# Patient Record
Sex: Female | Born: 1990 | Hispanic: No | Marital: Single | Smoking: Never smoker
Health system: Southern US, Community
[De-identification: ages and names within clinical notes are randomized; demographics above are authoritative.]

## PROBLEM LIST (undated history)

## (undated) DIAGNOSIS — K648 Other hemorrhoids: Secondary | ICD-10-CM

## (undated) DIAGNOSIS — D352 Benign neoplasm of pituitary gland: Secondary | ICD-10-CM

## (undated) DIAGNOSIS — E221 Hyperprolactinemia: Secondary | ICD-10-CM

## (undated) HISTORY — DX: Other hemorrhoids: K64.8

## (undated) HISTORY — PX: HEMORROIDECTOMY: SUR656

## (undated) HISTORY — DX: Benign neoplasm of pituitary gland: D35.2

---

## 2018-04-08 DIAGNOSIS — E221 Hyperprolactinemia: Secondary | ICD-10-CM | POA: Insufficient documentation

## 2018-04-08 DIAGNOSIS — D352 Benign neoplasm of pituitary gland: Secondary | ICD-10-CM | POA: Insufficient documentation

## 2019-02-08 DIAGNOSIS — E559 Vitamin D deficiency, unspecified: Secondary | ICD-10-CM | POA: Insufficient documentation

## 2019-07-03 ENCOUNTER — Telehealth: Payer: Self-pay

## 2019-07-03 NOTE — Telephone Encounter (Signed)
Patient called stating that she was having some spotting. Patient is coming for new ob on Sept 8th. Patient states it was some light pink spotting. Made here aware that this is ok unless it becomes heavier like a period then she would need to go to [facility] at [facility] for evaluation. Patient reports no other symptoms. Patient states understanding and agreeable

## 2019-07-31 ENCOUNTER — Other Ambulatory Visit: Payer: Self-pay

## 2019-07-31 ENCOUNTER — Ambulatory Visit (INDEPENDENT_AMBULATORY_CARE_PROVIDER_SITE_OTHER): Payer: BLUE CROSS/BLUE SHIELD | Admitting: Advanced Practice Midwife

## 2019-07-31 ENCOUNTER — Encounter: Payer: Self-pay | Admitting: Advanced Practice Midwife

## 2019-07-31 ENCOUNTER — Other Ambulatory Visit (HOSPITAL_COMMUNITY)
Admission: RE | Admit: 2019-07-31 | Discharge: 2019-07-31 | Disposition: A | Discharge status: Home / Self Care | Payer: BLUE CROSS/BLUE SHIELD | Source: Ambulatory Visit | Attending: Advanced Practice Midwife | Admitting: Advanced Practice Midwife

## 2019-07-31 DIAGNOSIS — Z3401 Encounter for supervision of normal first pregnancy, first trimester: Secondary | ICD-10-CM

## 2019-07-31 DIAGNOSIS — Z3A11 11 weeks gestation of pregnancy: Secondary | ICD-10-CM

## 2019-07-31 DIAGNOSIS — Z23 Encounter for immunization: Secondary | ICD-10-CM

## 2019-07-31 DIAGNOSIS — Z3400 Encounter for supervision of normal first pregnancy, unspecified trimester: Secondary | ICD-10-CM | POA: Insufficient documentation

## 2019-07-31 DIAGNOSIS — D352 Benign neoplasm of pituitary gland: Secondary | ICD-10-CM

## 2019-07-31 MED ORDER — AMBULATORY NON FORMULARY MEDICATION: 1.0000 | 0 refills | Status: DC

## 2019-07-31 NOTE — Progress Notes (Signed)
Pt had U/S last week in [facility] imaging.

## 2019-07-31 NOTE — Patient Instructions (Signed)
First Trimester of Pregnancy  The first trimester of pregnancy is from week 1 until the end of week 13 (months 1 through 3). During this time, your baby will begin to develop inside you. At 6-8 weeks, the eyes and face are formed, and the heartbeat can be seen on ultrasound. At the end of 12 weeks, all the baby's organs are formed. Prenatal care is all the medical care you receive before the birth of your baby. Make sure you get good prenatal care and follow all of your doctor's instructions. Follow these instructions at home: Medicines  Take over-the-counter and prescription medicines only as told by your doctor. Some medicines are safe and some medicines are not safe during pregnancy.  Take a prenatal vitamin that contains at least 600 micrograms (mcg) of folic acid.  If you have trouble pooping (constipation), take medicine that will make your stool soft (stool softener) if your doctor approves. Eating and drinking   Eat regular, healthy meals.  Your doctor will tell you the amount of weight gain that is right for you.  Avoid raw meat and uncooked cheese.  If you feel sick to your stomach (nauseous) or throw up (vomit): ? Eat 4 or 5 small meals a day instead of 3 large meals. ? Try eating a few soda crackers. ? Drink liquids between meals instead of during meals.  To prevent constipation: ? Eat foods that are high in fiber, like fresh fruits and vegetables, whole grains, and beans. ? Drink enough fluids to keep your pee (urine) clear or pale yellow. Activity  Exercise only as told by your doctor. Stop exercising if you have cramps or pain in your lower belly (abdomen) or low back.  Do not exercise if it is too hot, too humid, or if you are in a place of great height (high altitude).  Try to avoid standing for long periods of time. Move your legs often if you must stand in one place for a long time.  Avoid heavy lifting.  Wear low-heeled shoes. Sit and stand up straight.   You can have sex unless your doctor tells you not to. Relieving pain and discomfort  Wear a good support bra if your breasts are sore.  Take warm water baths (sitz baths) to soothe pain or discomfort caused by hemorrhoids. Use hemorrhoid cream if your doctor says it is okay.  Rest with your legs raised if you have leg cramps or low back pain.  If you have puffy, bulging veins (varicose veins) in your legs: ? Wear support hose or compression stockings as told by your doctor. ? Raise (elevate) your feet for 15 minutes, 3-4 times a day. ? Limit salt in your food. Prenatal care  Schedule your prenatal visits by the twelfth week of pregnancy.  Write down your questions. Take them to your prenatal visits.  Keep all your prenatal visits as told by your doctor. This is important. Safety  Wear your seat belt at all times when driving.  Make a list of emergency phone numbers. The list should include numbers for family, friends, the hospital, and police and fire departments. General instructions  Ask your doctor for a referral to a local prenatal class. Begin classes no later than at the start of month 6 of your pregnancy.  Ask for help if you need counseling or if you need help with nutrition. Your doctor can give you advice or tell you where to go for help.  Do not use hot tubs, steam   rooms, or saunas. °· Do not douche or use tampons or scented sanitary pads. °· Do not cross your legs for long periods of time. °· Avoid all herbs and alcohol. Avoid drugs that are not approved by your doctor. °· Do not use any tobacco products, including cigarettes, chewing tobacco, and electronic cigarettes. If you need help quitting, ask your doctor. You may get counseling or other support to help you quit. °· Avoid cat litter boxes and soil used by cats. These carry germs that can cause birth defects in the baby and can cause a loss of your baby (miscarriage) or stillbirth. °· Visit your dentist.  At home, brush your teeth with a soft toothbrush. Be gentle when you floss. °Contact a doctor if: °· You are dizzy. °· You have mild cramps or pressure in your lower belly. °· You have a nagging pain in your belly area. °· You continue to feel sick to your stomach, you throw up, or you have watery poop (diarrhea). °· You have a bad smelling fluid coming from your vagina. °· You have pain when you pee (urinate). °· You have increased puffiness (swelling) in your face, hands, legs, or ankles. °Get help right away if: °· You have a fever. °· You are leaking fluid from your vagina. °· You have spotting or bleeding from your vagina. °· You have very bad belly cramping or pain. °· You gain or lose weight rapidly. °· You throw up blood. It may look like coffee grounds. °· You are around people who have German measles, fifth disease, or chickenpox. °· You have a very bad headache. °· You have shortness of breath. °· You have any kind of trauma, such as from a fall or a car accident. °Summary °· The first trimester of pregnancy is from week 1 until the end of week 13 (months 1 through 3). °· To take care of yourself and your unborn baby, you will need to eat healthy meals, take medicines only if your doctor tells you to do so, and do activities that are safe for you and your baby. °· Keep all follow-up visits as told by your doctor. This is important as your doctor will have to ensure that your baby is healthy and growing well. °This information is not intended to replace advice given to you by your health care provider. Make sure you discuss any questions you have with your health care provider. °Document Released: 04/26/2008 Document Revised: 03/01/2019 Document Reviewed: 11/16/2016 °Elsevier Patient Education © 2020 [employer] ° °

## 2019-07-31 NOTE — Progress Notes (Signed)
  Subjective:    Sandra Smith is being seen today for her first obstetrical visit.  This is a planned pregnancy. She is at [redacted]w[redacted]d gestation. Her obstetrical history is significant for history of pituitary adenoma per pt report.  Was on medication until pregnancy then stopped.  . Relationship with FOB: spouse, living together. Patient does intend to breast feed. Pregnancy history fully reviewed.  Patient reports no complaints.  Review of Systems:   Review of Systems  Constitutional: Negative for chills, fatigue and fever.  Respiratory: Negative for shortness of breath.   Gastrointestinal: Negative for abdominal pain, constipation and diarrhea.  Genitourinary: Negative for dysuria, pelvic pain and vaginal bleeding.    Objective:     BP 95/70   Pulse 88   Ht 5\' 6"  (1.676 m)   Wt 64 kg   LMP 05/13/2019 (Exact Date)   BMI 22.78 kg/m  Physical Exam  Constitutional: She is oriented to person, place, and time. She appears well-developed and well-nourished. No distress.  HENT:  Head: Normocephalic.  Cardiovascular: Normal rate and regular rhythm.  Respiratory: Effort normal. No respiratory distress. She has no wheezes. She has no rales. She exhibits no tenderness.  Bilateral breast exam normal  GI: Soft.  Genitourinary:    Vulva normal.     Genitourinary Comments: Pap done EGBUS normal Cervix closed and long Uterus appropriate for GA   Musculoskeletal: Normal range of motion.  Neurological: She is alert and oriented to person, place, and time.  Skin: Skin is warm and dry.  Psychiatric: She has a normal mood and affect.    Maternal Exam:  Introitus: Normal vulva. Normal vagina.  Cervix: Cervix evaluated by digital exam.        Assessment:    Pregnancy: G1P0 Patient Active Problem List   Diagnosis Date Noted  . Supervision of normal first pregnancy, antepartum 07/31/2019       Plan:     Initial labs drawn. Prenatal vitamins. Problem list reviewed and  updated. Panorama discussed: requested. Role of ultrasound in pregnancy discussed; fetal survey: requested. Amniocentesis discussed: not indicated. Follow up in 4 weeks. 50% of 30 min visit spent on counseling and coordination of care.   Welcomed to practice. Routines reviewed Will send ROI to primary doctor to investigate details about pituitary adenoma    Hansel Feinstein 07/31/2019

## 2019-08-01 LAB — CYTOLOGY - PAP
Chlamydia: NEGATIVE
Diagnosis: NEGATIVE
Neisseria Gonorrhea: NEGATIVE

## 2019-08-02 LAB — URINE CULTURE, OB REFLEX

## 2019-08-02 LAB — CULTURE, OB URINE

## 2019-08-13 LAB — SMN1 COPY NUMBER ANALYSIS (SMA CARRIER SCREENING)

## 2019-08-13 LAB — OBSTETRIC PANEL, INCLUDING HIV
Antibody Screen: NEGATIVE
Basophils Absolute: 0 10*3/uL (ref 0.0–0.2)
Basos: 1 %
EOS (ABSOLUTE): 0.1 10*3/uL (ref 0.0–0.4)
Eos: 1 %
HIV Screen 4th Generation wRfx: NONREACTIVE
Hematocrit: 39.5 % (ref 34.0–46.6)
Hemoglobin: 12.3 g/dL (ref 11.1–15.9)
Hepatitis B Surface Ag: NEGATIVE
Immature Grans (Abs): 0 10*3/uL (ref 0.0–0.1)
Immature Granulocytes: 0 %
Lymphocytes Absolute: 1.6 10*3/uL (ref 0.7–3.1)
Lymphs: 22 %
MCH: 27 pg (ref 26.6–33.0)
MCHC: 31.1 g/dL — ABNORMAL LOW (ref 31.5–35.7)
MCV: 87 fL (ref 79–97)
Monocytes Absolute: 0.4 10*3/uL (ref 0.1–0.9)
Monocytes: 5 %
Neutrophils Absolute: 5.3 10*3/uL (ref 1.4–7.0)
Neutrophils: 71 %
Platelets: 175 10*3/uL (ref 150–450)
RBC: 4.56 x10E6/uL (ref 3.77–5.28)
RDW: 13.8 % (ref 11.7–15.4)
RPR Ser Ql: NONREACTIVE
Rh Factor: POSITIVE
Rubella Antibodies, IGG: 24.4 index (ref >0.99)
WBC: 7.5 10*3/uL (ref 3.4–10.8)

## 2019-08-13 LAB — HEMOGLOBINOPATHY EVALUATION
Ferritin: 23 ng/mL (ref 15–150)
Hgb A2 Quant: 2.7 % (ref 1.8–3.2)
Hgb A: 97.3 % (ref 96.4–98.8)
Hgb C: 0 %
Hgb F Quant: 0 % (ref 0.0–2.0)
Hgb S: 0 %
Hgb Solubility: NEGATIVE
Hgb Variant: 0 %

## 2019-08-13 LAB — CYSTIC FIBROSIS GENE TEST

## 2019-08-15 ENCOUNTER — Telehealth: Payer: Self-pay

## 2019-08-15 NOTE — Telephone Encounter (Signed)
Pt called the office requesting Panorama results. Pt made aware that due to insufficient cells we will have to repeat the Panorama. Understanding was voiced.  chiquita l wilson, CMA

## 2019-08-27 ENCOUNTER — Encounter: Payer: Self-pay | Admitting: Obstetrics & Gynecology

## 2019-08-27 ENCOUNTER — Ambulatory Visit (INDEPENDENT_AMBULATORY_CARE_PROVIDER_SITE_OTHER): Payer: BLUE CROSS/BLUE SHIELD | Admitting: Obstetrics & Gynecology

## 2019-08-27 ENCOUNTER — Other Ambulatory Visit: Payer: Self-pay

## 2019-08-27 VITALS — BP 106/71 | HR 79 | Wt 146.0 lb

## 2019-08-27 DIAGNOSIS — Z3A15 15 weeks gestation of pregnancy: Secondary | ICD-10-CM

## 2019-08-27 DIAGNOSIS — Z3402 Encounter for supervision of normal first pregnancy, second trimester: Secondary | ICD-10-CM

## 2019-08-27 DIAGNOSIS — D352 Benign neoplasm of pituitary gland: Secondary | ICD-10-CM

## 2019-08-27 DIAGNOSIS — Z3400 Encounter for supervision of normal first pregnancy, unspecified trimester: Secondary | ICD-10-CM

## 2019-08-27 NOTE — Progress Notes (Signed)
Repeat Panorama today due to insufficient cells. Kathrene Alu RN

## 2019-08-27 NOTE — Progress Notes (Signed)
   PRENATAL VISIT NOTE  Subjective:  Sandra Smith is a 28 y.o. G1P0 at [redacted]w[redacted]d being seen today for ongoing prenatal care.  She is currently monitored for the following issues for this low-risk pregnancy and has Supervision of normal first pregnancy, antepartum and Pituitary adenoma ([place]) on their problem list.  Patient reports no complaints. Had n/v which was resolved.   Contractions: Not present. Vag. Bleeding: None.  Movement: Absent. Denies leaking of fluid.   The following portions of the patient's history were reviewed and updated as appropriate: allergies, current medications, past family history, past medical history, past social history, past surgical history and problem list.   Objective:   Vitals:   08/27/19 0837  BP: 106/71  Pulse: 79  Weight: 146 lb (66.2 kg)    Fetal Status: Fetal Heart Rate (bpm): 160   Movement: Absent     General:  Alert, oriented and cooperative. Patient is in no acute distress.  Skin: Skin is warm and dry. No rash noted.   Cardiovascular: Normal heart rate noted  Respiratory: Normal respiratory effort, no problems with respiration noted  Abdomen: Soft, gravid, appropriate for gestational age.  Pain/Pressure: Absent     Pelvic: No pelvic exam indicated  Extremities: Normal range of motion.  Edema: None  Mental Status: Normal mood and affect. Normal behavior. Normal judgment and thought content.   Assessment and Plan:  Pregnancy: G1P0 at [redacted]w[redacted]d 1. Supervision of normal first pregnancy, antepartum Anatomy scan 11/2 Needs NIPS and AFP today  2. Pituitary adenoma ([place]) No results seen for MRI in system. Pt will sign release of records so we can get the results. .    3 vaginal bleeding in pregnancy Pt was seen at [facility] for vaginal bleeding >4 weeks prev. No records seen. She deneuis further bleeding.   Preterm labor symptoms and general obstetric precautions including but not limited to vaginal bleeding, contractions, leaking of fluid and fetal movement  were reviewed in detail with the patient. Please refer to After Visit Summary for other counseling recommendations.   Return in about 5 weeks (around 10/01/2019).  Future Appointments  Date Time Provider [place]  09/24/2019  2:30 PM WH-MFC [place] 1 WH-MFCUS MFC-US    Lavonia Drafts, MD

## 2019-08-27 NOTE — Patient Instructions (Signed)

## 2019-08-30 LAB — AFP, SERUM, OPEN SPINA BIFIDA
AFP MoM: 1.43
AFP Value: 42.9 ng/mL
Gest. Age on Collection Date: 15.1 weeks
Maternal Age At EDD: 30.1 yr
OSBR Risk 1 IN: 3311
Test Results:: NEGATIVE
Weight: 146 [lb_av]

## 2019-09-03 ENCOUNTER — Other Ambulatory Visit: Payer: Self-pay

## 2019-09-03 DIAGNOSIS — Z3400 Encounter for supervision of normal first pregnancy, unspecified trimester: Secondary | ICD-10-CM

## 2019-09-24 ENCOUNTER — Ambulatory Visit (HOSPITAL_COMMUNITY)
Admission: RE | Admit: 2019-09-24 | Discharge: 2019-09-24 | Disposition: A | Discharge status: Home / Self Care | Payer: BLUE CROSS/BLUE SHIELD | Source: Ambulatory Visit | Attending: Advanced Practice Midwife | Admitting: Advanced Practice Midwife

## 2019-09-24 ENCOUNTER — Other Ambulatory Visit: Payer: Self-pay

## 2019-09-24 DIAGNOSIS — Z363 Encounter for antenatal screening for malformations: Secondary | ICD-10-CM

## 2019-09-24 DIAGNOSIS — Z3A19 19 weeks gestation of pregnancy: Secondary | ICD-10-CM

## 2019-09-24 DIAGNOSIS — Z3400 Encounter for supervision of normal first pregnancy, unspecified trimester: Secondary | ICD-10-CM | POA: Diagnosis present

## 2019-10-01 ENCOUNTER — Other Ambulatory Visit: Payer: Self-pay

## 2019-10-01 ENCOUNTER — Ambulatory Visit (INDEPENDENT_AMBULATORY_CARE_PROVIDER_SITE_OTHER): Payer: BLUE CROSS/BLUE SHIELD | Admitting: Obstetrics & Gynecology

## 2019-10-01 VITALS — BP 105/71 | HR 108 | Wt 154.0 lb

## 2019-10-01 DIAGNOSIS — O26892 Other specified pregnancy related conditions, second trimester: Secondary | ICD-10-CM

## 2019-10-01 DIAGNOSIS — Z3400 Encounter for supervision of normal first pregnancy, unspecified trimester: Secondary | ICD-10-CM

## 2019-10-01 DIAGNOSIS — Z3A20 20 weeks gestation of pregnancy: Secondary | ICD-10-CM

## 2019-10-01 DIAGNOSIS — D352 Benign neoplasm of pituitary gland: Secondary | ICD-10-CM

## 2019-10-01 NOTE — Patient Instructions (Signed)

## 2019-10-01 NOTE — Progress Notes (Signed)
   PRENATAL VISIT NOTE  Subjective:  Sandra Smith is a 28 y.o. G1P0 at [redacted]w[redacted]d being seen today for ongoing prenatal care.  She is currently monitored for the following issues for this low-risk pregnancy and has Supervision of normal first pregnancy, antepartum and Pituitary adenoma ([place]) on their problem list.  Patient reports numbness in shoulder adn legs at night occ. .  Contractions: Not present. Vag. Bleeding: None.  Movement: Present. Denies leaking of fluid.   The following portions of the patient's history were reviewed and updated as appropriate: allergies, current medications, past family history, past medical history, past social history, past surgical history and problem list.   Objective:   Vitals:   10/01/19 0942  BP: 105/71  Pulse: (!) 108  Weight: 154 lb (69.9 kg)    Fetal Status: Fetal Heart Rate (bpm): 160   Movement: Present     General:  Alert, oriented and cooperative. Patient is in no acute distress.  Skin: Skin is warm and dry. No rash noted.   Cardiovascular: Normal heart rate noted  Respiratory: Normal respiratory effort, no problems with respiration noted  Abdomen: Soft, gravid, appropriate for gestational age.  Pain/Pressure: Absent     Pelvic: Cervical exam deferred        Extremities: Normal range of motion.  Edema: None  Mental Status: Normal mood and affect. Normal behavior. Normal judgment and thought content.   Assessment and Plan:  Pregnancy: G1P0 at [redacted]w[redacted]d 1. Supervision of normal first pregnancy, antepartum 2 hour GTT and labs next visit   2. Pituitary adenoma ([place]) S/p MRI 10/9/202 from [facility] 60mm microadenoma  asymptomatic  Preterm labor symptoms and general obstetric precautions including but not limited to vaginal bleeding, contractions, leaking of fluid and fetal movement were reviewed in detail with the patient. Please refer to After Visit Summary for other counseling recommendations.   F/u in 8 weeks or sooner prn  No future appointments.   Lavonia Drafts, MD

## 2019-12-04 ENCOUNTER — Encounter: Payer: Self-pay | Admitting: Advanced Practice Midwife

## 2019-12-04 ENCOUNTER — Ambulatory Visit (INDEPENDENT_AMBULATORY_CARE_PROVIDER_SITE_OTHER): Payer: Medicaid Other | Admitting: Advanced Practice Midwife

## 2019-12-04 ENCOUNTER — Other Ambulatory Visit: Payer: Self-pay

## 2019-12-04 VITALS — BP 114/72 | HR 89 | Wt 173.0 lb

## 2019-12-04 DIAGNOSIS — Z23 Encounter for immunization: Secondary | ICD-10-CM | POA: Diagnosis not present

## 2019-12-04 DIAGNOSIS — D352 Benign neoplasm of pituitary gland: Secondary | ICD-10-CM

## 2019-12-04 DIAGNOSIS — Z3400 Encounter for supervision of normal first pregnancy, unspecified trimester: Secondary | ICD-10-CM

## 2019-12-04 DIAGNOSIS — Z3A29 29 weeks gestation of pregnancy: Secondary | ICD-10-CM

## 2019-12-04 DIAGNOSIS — O26893 Other specified pregnancy related conditions, third trimester: Secondary | ICD-10-CM

## 2019-12-04 NOTE — Progress Notes (Signed)
   PRENATAL VISIT NOTE  Subjective:  Sandra Smith is a 29 y.o. G1P0 at [redacted]w[redacted]d being seen today for ongoing prenatal care.  She is currently monitored for the following issues for this high-risk pregnancy and has Supervision of normal first pregnancy, antepartum; Pituitary adenoma ([place]); Hyperprolactinemia ([place]); and Vitamin D deficiency on their problem list.  Patient reports no complaints.  Contractions: Not present. Vag. Bleeding: None.  Movement: Present. Denies leaking of fluid.   The following portions of the patient's history were reviewed and updated as appropriate: allergies, current medications, past family history, past medical history, past social history, past surgical history and problem list.   Objective:   Vitals:   12/04/19 0815  BP: 114/72  Pulse: 89  Weight: 173 lb (78.5 kg)    Fetal Status:     Movement: Present     General:  Alert, oriented and cooperative. Patient is in no acute distress.  Skin: Skin is warm and dry. No rash noted.   Cardiovascular: Normal heart rate noted  Respiratory: Normal respiratory effort, no problems with respiration noted  Abdomen: Soft, gravid, appropriate for gestational age.  Pain/Pressure: Absent     Pelvic: Cervical exam deferred        Extremities: Normal range of motion.  Edema: Trace  Mental Status: Normal mood and affect. Normal behavior. Normal judgment and thought content.   Assessment and Plan:  Pregnancy: G1P0 at [redacted]w[redacted]d 1. Pituitary adenoma [facility])     Doing well     Consult Dr [facility] re: special monitoring     Her MD took her off Dostinex during pregnancy  2.  Sup Normal Pregnancy      Glucola and labs today  3.   Needs TDAP       Wants to get vaccine today  Preterm labor symptoms and general obstetric precautions including but not limited to vaginal bleeding, contractions, leaking of fluid and fetal movement were reviewed in detail with the patient. Please refer to After Visit Summary for other counseling recommendations.    Return in about 3 weeks (around 12/25/2019) for [facility].    Hansel Feinstein, CNM

## 2019-12-04 NOTE — Patient Instructions (Addendum)
Third Trimester of Pregnancy  The third trimester is from week 28 through week 40 (months 7 through 9). This trimester is when your unborn baby (fetus) is growing very fast. At the end of the ninth month, the unborn baby is about 20 inches in length. It weighs about 6-10 pounds. Follow these instructions at home: Medicines  Take over-the-counter and prescription medicines only as told by your doctor. Some medicines are safe and some medicines are not safe during pregnancy.  Take a prenatal vitamin that contains at least 600 micrograms (mcg) of folic acid.  If you have trouble pooping (constipation), take medicine that will make your stool soft (stool softener) if your doctor approves. Eating and drinking   Eat regular, healthy meals.  Avoid raw meat and uncooked cheese.  If you get low calcium from the food you eat, talk to your doctor about taking a daily calcium supplement.  Eat four or five small meals rather than three large meals a day.  Avoid foods that are high in fat and sugars, such as fried and sweet foods.  To prevent constipation: ? Eat foods that are high in fiber, like fresh fruits and vegetables, whole grains, and beans. ? Drink enough fluids to keep your pee (urine) clear or pale yellow. Activity  Exercise only as told by your doctor. Stop exercising if you start to have cramps.  Avoid heavy lifting, wear low heels, and sit up straight.  Do not exercise if it is too hot, too humid, or if you are in a place of great height (high altitude).  You may continue to have sex unless your doctor tells you not to. Relieving pain and discomfort  Wear a good support bra if your breasts are tender.  Take frequent breaks and rest with your legs raised if you have leg cramps or low back pain.  Take warm water baths (sitz baths) to soothe pain or discomfort caused by hemorrhoids. Use hemorrhoid cream if your doctor approves.  If you develop puffy, bulging veins (varicose  veins) in your legs: ? Wear support hose or compression stockings as told by your doctor. ? Raise (elevate) your feet for 15 minutes, 3-4 times a day. ? Limit salt in your food. Safety  Wear your seat belt when driving.  Make a list of emergency phone numbers, including numbers for family, friends, the hospital, and police and fire departments. Preparing for your baby's arrival To prepare for the arrival of your baby:  Take prenatal classes.  Practice driving to the hospital.  Visit the hospital and tour the maternity area.  Talk to your work about taking leave once the baby comes.  Pack your hospital bag.  Prepare the baby's room.  Go to your doctor visits.  Buy a rear-facing car seat. Learn how to install it in your car. General instructions  Do not use hot tubs, steam rooms, or saunas.  Do not use any products that contain nicotine or tobacco, such as cigarettes and e-cigarettes. If you need help quitting, ask your doctor.  Do not drink alcohol.  Do not douche or use tampons or scented sanitary pads.  Do not cross your legs for long periods of time.  Do not travel for long distances unless you must. Only do so if your doctor says it is okay.  Visit your dentist if you have not gone during your pregnancy. Use a soft toothbrush to brush your teeth. Be gentle when you floss.  Avoid cat litter boxes and soil   used by cats. These carry germs that can cause birth defects in the baby and can cause a loss of your baby (miscarriage) or stillbirth.  Keep all your prenatal visits as told by your doctor. This is important. Contact a doctor if:  You are not sure if you are in labor or if your water has broken.  You are dizzy.  You have mild cramps or pressure in your lower belly.  You have a nagging pain in your belly area.  You continue to feel sick to your stomach, you throw up, or you have watery poop.  You have bad smelling fluid coming from your vagina.  You have  pain when you pee. Get help right away if:  You have a fever.  You are leaking fluid from your vagina.  You are spotting or bleeding from your vagina.  You have severe belly cramps or pain.  You lose or gain weight quickly.  You have trouble catching your breath and have chest pain.  You notice sudden or extreme puffiness (swelling) of your face, hands, ankles, feet, or legs.  You have not felt the baby move in over an hour.  You have severe headaches that do not go away with medicine.  You have trouble seeing.  You are leaking, or you are having a gush of fluid, from your vagina before you are 37 weeks.  You have regular belly spasms (contractions) before you are 37 weeks. Summary  The third trimester is from week 28 through week 40 (months 7 through 9). This time is when your unborn baby is growing very fast.  Follow your doctor's advice about medicine, food, and activity.  Get ready for the arrival of your baby by taking prenatal classes, getting all the baby items ready, preparing the baby's room, and visiting your doctor to be checked.  Get help right away if you are bleeding from your vagina, or you have chest pain and trouble catching your breath, or if you have not felt your baby move in over an hour. This information is not intended to replace advice given to you by your health care provider. Make sure you discuss any questions you have with your health care provider. Document Revised: 03/01/2019 Document Reviewed: 12/14/2016 Elsevier Patient Education  [place]. https://www.cdc.gov/vaccines/hcp/vis/vis-statements/tdap.pdf">  Tdap (Tetanus, Diphtheria, Pertussis) Vaccine: What You Need to Know 1. Why get vaccinated? Tdap vaccine can prevent tetanus, diphtheria, and pertussis. Diphtheria and pertussis spread from person to person. Tetanus enters the body through cuts or wounds. TETANUS (T) causes painful stiffening of the muscles. Tetanus can lead to  serious health problems, including being unable to open the mouth, having trouble swallowing and breathing, or death. DIPHTHERIA (D) can lead to difficulty breathing, heart failure, paralysis, or death. PERTUSSIS (aP), also known as "whooping cough," can cause uncontrollable, violent coughing which makes it hard to breathe, eat, or drink. Pertussis can be extremely serious in babies and young children, causing pneumonia, convulsions, brain damage, or death. In teens and adults, it can cause weight loss, loss of bladder control, passing out, and rib fractures from severe coughing. 2. Tdap vaccine Tdap is only for children 7 years and older, adolescents, and adults.  Adolescents should receive a single dose of Tdap, preferably at age 42 or 19 years. Pregnant women should get a dose of Tdap during every pregnancy, to protect the newborn from pertussis. Infants are most at risk for severe, life-threatening complications from pertussis. Adults who have never received Tdap should  get a dose of Tdap. Also, adults should receive a booster dose every 10 years, or earlier in the case of a severe and dirty wound or burn. Booster doses can be either Tdap or Td (a different vaccine that protects against tetanus and diphtheria but not pertussis). Tdap may be given at the same time as other vaccines. 3. Talk with your health care provider Tell your vaccine provider if the person getting the vaccine: Has had an allergic reaction after a previous dose of any vaccine that protects against tetanus, diphtheria, or pertussis, or has any severe, life-threatening allergies. Has had a coma, decreased level of consciousness, or prolonged seizures within 7 days after a previous dose of any pertussis vaccine (DTP, DTaP, or Tdap). Has seizures or another nervous system problem. Has ever had Guillain-Barr Syndrome (also called GBS). Has had severe pain or swelling after a previous dose of any vaccine that protects against  tetanus or diphtheria. In some cases, your health care provider may decide to postpone Tdap vaccination to a future visit.  People with minor illnesses, such as a cold, may be vaccinated. People who are moderately or severely ill should usually wait until they recover before getting Tdap vaccine.  Your health care provider can give you more information. 4. Risks of a vaccine reaction Pain, redness, or swelling where the shot was given, mild fever, headache, feeling tired, and nausea, vomiting, diarrhea, or stomachache sometimes happen after Tdap vaccine. People sometimes faint after medical procedures, including vaccination. Tell your provider if you feel dizzy or have vision changes or ringing in the ears.  As with any medicine, there is a very remote chance of a vaccine causing a severe allergic reaction, other serious injury, or death. 5. What if there is a serious problem? An allergic reaction could occur after the vaccinated person leaves the clinic. If you see signs of a severe allergic reaction (hives, swelling of the face and throat, difficulty breathing, a fast heartbeat, dizziness, or weakness), call 9-1-1 and get the person to the nearest hospital. For other signs that concern you, call your health care provider.  Adverse reactions should be reported to the Vaccine Adverse Event Reporting System (VAERS). Your health care provider will usually file this report, or you can do it yourself. Visit the VAERS website at www.vaers.SamedayNews.es or call (435) 844-8383. VAERS is only for reporting reactions, and VAERS staff do not give medical advice. 6. The National Vaccine Injury Compensation Program The [employer] Vaccine Injury Compensation Program (VICP) is a federal program that was created to compensate people who may have been injured by certain vaccines. Visit the VICP website at GoldCloset.com.ee or call 704-358-1707 to learn about the program and about filing a claim. There is a  time limit to file a claim for compensation. 7. How can I learn more? Ask your health care provider. Call your local or state health department. Contact the Centers for Disease Control and Prevention (CDC): Call 7175905975 (1-800-CDC-INFO) or Visit CDC's website at http://hunter.com/ Vaccine Information Statement Tdap (Tetanus, Diphtheria, Pertussis) Vaccine (02/21/2019) This information is not intended to replace advice given to you by your health care provider. Make sure you discuss any questions you have with your health care provider. Document Revised: 03/02/2019 Document Reviewed: 03/05/2019 Elsevier Patient Education  [place].

## 2019-12-05 LAB — CBC
Hematocrit: 35.4 % (ref 34.0–46.6)
Hemoglobin: 11.8 g/dL (ref 11.1–15.9)
MCH: 29 pg (ref 26.6–33.0)
MCHC: 33.3 g/dL (ref 31.5–35.7)
MCV: 87 fL (ref 79–97)
Platelets: 184 10*3/uL (ref 150–450)
RBC: 4.07 x10E6/uL (ref 3.77–5.28)
RDW: 12.1 % (ref 11.7–15.4)
WBC: 8 10*3/uL (ref 3.4–10.8)

## 2019-12-05 LAB — GLUCOSE TOLERANCE, 2 HOURS W/ 1HR
Glucose, 1 hour: 161 mg/dL (ref 65–179)
Glucose, 2 hour: 97 mg/dL (ref 65–152)
Glucose, Fasting: 89 mg/dL (ref 65–91)

## 2019-12-05 LAB — HIV ANTIBODY (ROUTINE TESTING W REFLEX): HIV Screen 4th Generation wRfx: NONREACTIVE

## 2019-12-05 LAB — RPR: RPR Ser Ql: NONREACTIVE

## 2019-12-25 ENCOUNTER — Telehealth (INDEPENDENT_AMBULATORY_CARE_PROVIDER_SITE_OTHER): Payer: Medicaid Other | Admitting: Advanced Practice Midwife

## 2019-12-25 ENCOUNTER — Encounter: Payer: Self-pay | Admitting: Advanced Practice Midwife

## 2019-12-25 VITALS — BP 105/70

## 2019-12-25 DIAGNOSIS — Z3A32 32 weeks gestation of pregnancy: Secondary | ICD-10-CM

## 2019-12-25 DIAGNOSIS — Z3400 Encounter for supervision of normal first pregnancy, unspecified trimester: Secondary | ICD-10-CM

## 2019-12-25 NOTE — Progress Notes (Signed)
I connected with@ on 12/25/19 at  9:45 AM EST by: MyChart Video and verified that I am speaking with the correct person using two identifiers.  Patient is located at home and provider is located at office at [facility].     The purpose of this virtual visit is to provide medical care while limiting exposure to the novel coronavirus. I discussed the limitations, risks, security and privacy concerns of performing an evaluation and management service by video and the availability of in person appointments. I also discussed with the patient that there may be a patient responsible charge related to this service. By engaging in this virtual visit, you consent to the provision of healthcare.  Additionally, you authorize for your insurance to be billed for the services provided during this visit.  The patient expressed understanding and agreed to proceed.  The following staff members participated in the virtual visit:  Kathrene Alu RN    PRENATAL VISIT NOTE  Subjective:  Sandra Smith is a 29 y.o. G1P0 at [redacted]w[redacted]d  for phone visit for ongoing prenatal care.  She is currently monitored for the following issues for this low-risk pregnancy and has Supervision of normal first pregnancy, antepartum; Pituitary adenoma ([place]); Hyperprolactinemia ([place]); and Vitamin D deficiency on their problem list.  Patient reports some sleep disturnance due to having to urinate twice and nasal congestion.  Contractions: Not present. Vag. Bleeding: None.  Movement: Present. Denies leaking of fluid.   The following portions of the patient's history were reviewed and updated as appropriate: allergies, current medications, past family history, past medical history, past social history, past surgical history and problem list.   Objective:   Vitals:   12/25/19 0948  BP: 105/70   Self-Obtained  Fetal Status:     Movement: Present     Assessment and Plan:  Pregnancy: G1P0 at [redacted]w[redacted]d  Nasal congestion at night Advised to get some saline  nasal spray  Sleep disturbance Not taking Melatonin Able to go back to sleep Voids twice per night Nasal congestion wakes her up  Preterm labor symptoms and general obstetric precautions including but not limited to vaginal bleeding, contractions, leaking of fluid and fetal movement were reviewed in detail with the patient.  Return in about 2 weeks (around 01/08/2020) for TELEHEALTH VISIT.   Time spent on virtual visit: 7 minutes  Hansel Feinstein, CNM

## 2019-12-25 NOTE — Patient Instructions (Signed)

## 2020-01-08 ENCOUNTER — Encounter (HOSPITAL_COMMUNITY): Payer: Self-pay | Admitting: Obstetrics and Gynecology

## 2020-01-08 ENCOUNTER — Encounter: Payer: Self-pay | Admitting: Advanced Practice Midwife

## 2020-01-08 ENCOUNTER — Inpatient Hospital Stay (HOSPITAL_COMMUNITY)
Admission: AD | Admit: 2020-01-08 | Discharge: 2020-01-08 | Disposition: A | Discharge status: Home / Self Care | Payer: Medicaid Other | Attending: Obstetrics and Gynecology | Admitting: Obstetrics and Gynecology

## 2020-01-08 ENCOUNTER — Other Ambulatory Visit: Payer: Self-pay

## 2020-01-08 ENCOUNTER — Telehealth (INDEPENDENT_AMBULATORY_CARE_PROVIDER_SITE_OTHER): Payer: Medicaid Other | Admitting: Advanced Practice Midwife

## 2020-01-08 DIAGNOSIS — Z3689 Encounter for other specified antenatal screening: Secondary | ICD-10-CM

## 2020-01-08 DIAGNOSIS — Z3A19 19 weeks gestation of pregnancy: Secondary | ICD-10-CM

## 2020-01-08 DIAGNOSIS — O368130 Decreased fetal movements, third trimester, not applicable or unspecified: Secondary | ICD-10-CM | POA: Insufficient documentation

## 2020-01-08 DIAGNOSIS — Z3400 Encounter for supervision of normal first pregnancy, unspecified trimester: Secondary | ICD-10-CM

## 2020-01-08 DIAGNOSIS — Z3A34 34 weeks gestation of pregnancy: Secondary | ICD-10-CM

## 2020-01-08 DIAGNOSIS — O368190 Decreased fetal movements, unspecified trimester, not applicable or unspecified: Secondary | ICD-10-CM | POA: Insufficient documentation

## 2020-01-08 DIAGNOSIS — O368120 Decreased fetal movements, second trimester, not applicable or unspecified: Secondary | ICD-10-CM

## 2020-01-08 HISTORY — DX: Hyperprolactinemia: E22.1

## 2020-01-08 NOTE — MAU Provider Note (Signed)
History   [id number]   Chief Complaint  Patient presents with  . Decreased Fetal Movement    HPI Sandra Smith is a 29 y.o. female  G1P0 here with report of decreased fetal movement since last night.  Reports feeling the baby move approximately no times in the past 24 hour.  Denies vaginal bleeding or leaking of fluid. Denies contractions. States she normally feels an increase in movements at night but only felt one movement last night. Today reports no movement.   Patient's last menstrual period was 05/13/2019 (exact date).  OB History  Gravida Para Term Preterm AB Living  1            SAB TAB Ectopic Multiple Live Births               # Outcome Date GA Lbr Len/2nd Weight Sex Delivery Anes PTL Lv  1 Current             Past Medical History:  Diagnosis Date  . Hyperprolactinemia ([place])   . Internal hemorrhoids   . Pituitary microadenoma ([place])     Family History  Problem Relation Age of Onset  . Diabetes Father     Social History   Socioeconomic History  . Marital status: Single    Spouse name: Not on file  . Number of children: Not on file  . Years of education: Not on file  . Highest education level: Not on file  Occupational History  . Not on file  Tobacco Use  . Smoking status: Never Smoker  . Smokeless tobacco: Never Used  Substance and Sexual Activity  . Alcohol use: Never  . Drug use: Never  . Sexual activity: Not Currently    Birth control/protection: None  Other Topics Concern  . Not on file  Social History Narrative  . Not on file   Social Determinants of Health   Financial Resource Strain:   . Difficulty of Paying Living Expenses: Not on file  Food Insecurity:   . Worried About Charity fundraiser in the Last Year: Not on file  . Ran Out of Food in the Last Year: Not on file  Transportation Needs:   . Lack of Transportation (Medical): Not on file  . Lack of Transportation (Non-Medical): Not on file  Physical Activity:   . Days of Exercise  per Week: Not on file  . Minutes of Exercise per Session: Not on file  Stress:   . Feeling of Stress : Not on file  Social Connections:   . Frequency of Communication with Friends and Family: Not on file  . Frequency of Social Gatherings with Friends and Family: Not on file  . Attends Religious Services: Not on file  . Active Member of Clubs or Organizations: Not on file  . Attends Archivist Meetings: Not on file  . Marital Status: Not on file    No Known Allergies  No current facility-administered medications on file prior to encounter.   Current Outpatient Medications on File Prior to Encounter  Medication Sig Dispense Refill  . Prenatal Vit-Fe Fumarate-FA (PRENATAL VITAMINS PO) Take by mouth.    . AMBULATORY NON FORMULARY MEDICATION 1 Device by Other route once a week. Blood pressure cuff/Medium  Monitored Regularly at home ICD 10: Z34.90 LROB 1 kit 0  . cabergoline (DOSTINEX) 0.5 MG tablet Take by mouth.    . folic acid (FOLVITE) 092 MCG tablet Take 400 mcg by mouth daily.  Review of Systems  Constitutional: Negative.   Gastrointestinal: Negative.   Genitourinary: Negative.      Physical Exam   Vitals:   01/08/20 1114  BP: 131/84  Pulse: 93  Resp: 16  Temp: 98.2 F (36.8 C)  TempSrc: Oral  SpO2: 100%  Weight: 78.9 kg    Physical Exam  Nursing note and vitals reviewed. Constitutional: She appears well-developed and well-nourished. No distress.  Respiratory: Effort normal. No respiratory distress.  GI: Soft. There is no abdominal tenderness.  Skin: Skin is warm and dry. She is not diaphoretic.  Psychiatric: She has a normal mood and affect. Her behavior is normal. Judgment and thought content normal.   NST:  Baseline: 145 bpm, Variability: Good {> 6 bpm), Accelerations: Reactive and Decelerations: Absent  MAU Course  Procedures  MDM Reactive NST Patient reports fetal movement while in MAU & marked 5 movements in 20 minutes.  Discussed  fetal kick counts at home & reasons to return   Assessment and Plan  A: 1. Decreased fetal movements in third trimester, single or unspecified fetus   2. NST (non-stress test) reactive   3. [redacted] weeks gestation of pregnancy    P: Discharge home Fetal movement form given Keep f/u with OB   Jorje Guild, NP 01/08/2020 12:03 PM

## 2020-01-08 NOTE — MAU Note (Signed)
. .  Sandra Smith is a 29 y.o. at [redacted]w[redacted]d here in MAU reporting: patient hasn't felt baby move since 2300 last night. She reports No VB or LOF. She had video visit this morning and they instructed her to come in to be seen.   Pain score: 0 Vitals:   01/08/20 1114  BP: 131/84  Pulse: 93  Resp: 16  Temp: 98.2 F (36.8 C)  SpO2: 100%     FHT:149 Lab orders placed from triage:

## 2020-01-08 NOTE — MAU Note (Signed)
Sent from clinic for NST.  Decreased FM x2 days.

## 2020-01-08 NOTE — Progress Notes (Signed)
I connected with patient on 01/08/20 at  9:10 AM EST by: MyChart Video and verified that I am speaking with the correct person using two identifiers.  Patient is located at home and provider is located at [facility] office.     The purpose of this virtual visit is to provide medical care while limiting exposure to the novel coronavirus. I discussed the limitations, risks, security and privacy concerns of performing an evaluation and management service by myself and the availability of in person appointments. I also discussed with the patient that there may be a patient responsible charge related to this service. By engaging in this virtual visit, you consent to the provision of healthcare.  Additionally, you authorize for your insurance to be billed for the services provided during this visit.  The patient expressed understanding and agreed to proceed.  The following staff members participated in the virtual visit:  Wendelyn Breslow CMA    PRENATAL VISIT NOTE  Subjective:  Sandra Smith is a 29 y.o. G1P0 at [redacted]w[redacted]d  for phone visit for ongoing prenatal care.  She is currently monitored for the following issues for this low-risk pregnancy and has Supervision of normal first pregnancy, antepartum; Pituitary adenoma ([place]); Hyperprolactinemia ([place]); Vitamin D deficiency; and Decreased fetal movement on their problem list.  Patient reports very little movement in past two days.  Sleep has been better.   .  .   . Denies leaking of fluid.   The following portions of the patient's history were reviewed and updated as appropriate: allergies, current medications, past family history, past medical history, past social history, past surgical history and problem list.   Objective:  There were no vitals filed for this visit. Self-Obtained  Fetal Status:          Decreased movement x 2 days  Assessment and Plan:  Pregnancy: G1P0 at [redacted]w[redacted]d 1. Decreased fetal movements in third trimester, single or unspecified  fetus Advised to go to [facility] for NST If nonreactive might suggest BPP  Preterm labor symptoms and general obstetric precautions including but not limited to vaginal bleeding, contractions, leaking of fluid and fetal movement were reviewed in detail with the patient.  RTO 2 weeks at [facility]  Time spent on virtual visit: 6 minutes  Hansel Feinstein, CNM

## 2020-01-08 NOTE — Discharge Instructions (Signed)
Fetal Movement Counts Patient Name: ________________________________________________ Patient Due Date: ____________________ What is a fetal movement count?  A fetal movement count is the number of times that you feel your baby move during a certain amount of time. This may also be called a fetal kick count. A fetal movement count is recommended for every pregnant woman. You may be asked to start counting fetal movements as early as week 28 of your pregnancy. Pay attention to when your baby is most active. You may notice your baby's sleep and wake cycles. You may also notice things that make your baby move more. You should do a fetal movement count:  When your baby is normally most active.  At the same time each day. A good time to count movements is while you are resting, after having something to eat and drink. How do I count fetal movements? 1. Find a quiet, comfortable area. Sit, or lie down on your side. 2. Write down the date, the start time and stop time, and the number of movements that you felt between those two times. Take this information with you to your health care visits. 3. Write down your start time when you feel the first movement. 4. Count kicks, flutters, swishes, rolls, and jabs. You should feel at least 10 movements. 5. You may stop counting after you have felt 10 movements, or if you have been counting for 2 hours. Write down the stop time. 6. If you do not feel 10 movements in 2 hours, contact your health care provider for further instructions. Your health care provider may want to do additional tests to assess your baby's well-being. Contact a health care provider if:  You feel fewer than 10 movements in 2 hours.  Your baby is not moving like he or she usually does. Date: ____________ Start time: ____________ Stop time: ____________ Movements: ____________ Date: ____________ Start time: ____________ Stop time: ____________ Movements: ____________ Date: ____________  Start time: ____________ Stop time: ____________ Movements: ____________ Date: ____________ Start time: ____________ Stop time: ____________ Movements: ____________ Date: ____________ Start time: ____________ Stop time: ____________ Movements: ____________ Date: ____________ Start time: ____________ Stop time: ____________ Movements: ____________ Date: ____________ Start time: ____________ Stop time: ____________ Movements: ____________ Date: ____________ Start time: ____________ Stop time: ____________ Movements: ____________ Date: ____________ Start time: ____________ Stop time: ____________ Movements: ____________ This information is not intended to replace advice given to you by your health care provider. Make sure you discuss any questions you have with your health care provider. Document Revised: 06/28/2019 Document Reviewed: 06/28/2019 Elsevier Patient Education  2020 Else[employer] Inc.  

## 2020-01-23 ENCOUNTER — Ambulatory Visit (INDEPENDENT_AMBULATORY_CARE_PROVIDER_SITE_OTHER): Payer: Medicaid Other | Admitting: Family Medicine

## 2020-01-23 ENCOUNTER — Other Ambulatory Visit (HOSPITAL_COMMUNITY)
Admission: RE | Admit: 2020-01-23 | Discharge: 2020-01-23 | Disposition: A | Discharge status: Home / Self Care | Payer: Medicaid Other | Source: Ambulatory Visit | Attending: Family Medicine | Admitting: Family Medicine

## 2020-01-23 ENCOUNTER — Other Ambulatory Visit: Payer: Self-pay

## 2020-01-23 VITALS — BP 132/80 | HR 92 | Wt 191.0 lb

## 2020-01-23 DIAGNOSIS — Z3A36 36 weeks gestation of pregnancy: Secondary | ICD-10-CM

## 2020-01-23 DIAGNOSIS — Z3400 Encounter for supervision of normal first pregnancy, unspecified trimester: Secondary | ICD-10-CM | POA: Diagnosis present

## 2020-01-23 DIAGNOSIS — O321XX0 Maternal care for breech presentation, not applicable or unspecified: Secondary | ICD-10-CM | POA: Diagnosis not present

## 2020-01-23 NOTE — Progress Notes (Signed)
   PRENATAL VISIT NOTE  Subjective:  Sandra Smith is a 29 y.o. G1P0 at [redacted]w[redacted]d being seen today for ongoing prenatal care.  She is currently monitored for the following issues for this low-risk pregnancy and has Supervision of normal first pregnancy, antepartum; Pituitary adenoma ([place]); Hyperprolactinemia ([place]); Vitamin D deficiency; and Decreased fetal movement on their problem list.  Patient reports no complaints.  Contractions: Not present.  .  Movement: Present. Denies leaking of fluid.   The following portions of the patient's history were reviewed and updated as appropriate: allergies, current medications, past family history, past medical history, past social history, past surgical history and problem list.   Objective:   Vitals:   01/23/20 1307  BP: 132/80  Pulse: 92  Weight: 191 lb (86.6 kg)    Fetal Status: Fetal Heart Rate (bpm): 141 Fundal Height: 33 cm Movement: Present  Presentation: Complete Breech  General:  Alert, oriented and cooperative. Patient is in no acute distress.  Skin: Skin is warm and dry. No rash noted.   Cardiovascular: Normal heart rate noted  Respiratory: Normal respiratory effort, no problems with respiration noted  Abdomen: Soft, gravid, appropriate for gestational age.  Pain/Pressure: Absent     Pelvic: Cervical exam performed Dilation: Closed Effacement (%): Thick Station: Ballotable  Extremities: Normal range of motion.  Edema: Mild pitting, slight indentation  Mental Status: Normal mood and affect. Normal behavior. Normal judgment and thought content.  Limited u/s shows head in LUQ Assessment and Plan:  Pregnancy: G1P0 at [redacted]w[redacted]d 1. Supervision of normal first pregnancy, antepartum Cultures today Breech--offered ECV, moxibustion, primary C-section. Risks reviewed. She will discuss with her husband and let us know her decision. Would try for ECV on Monday 3/8 if wants at 37 wks. Otherwise may schedule PCS at 39 wks. - Culture, beta strep (group b  only) - Cervicovaginal ancillary only( [place])  Preterm labor symptoms and general obstetric precautions including but not limited to vaginal bleeding, contractions, leaking of fluid and fetal movement were reviewed in detail with the patient. Please refer to After Visit Summary for other counseling recommendations.   Return in 1 week (on 01/30/2020) for virtual.  Future Appointments  Date Time Provider Max  01/29/2020  8:10 AM Seabron Spates, CNM [facility] None    [name], MD

## 2020-01-23 NOTE — Patient Instructions (Signed)

## 2020-01-23 NOTE — Progress Notes (Signed)
Bedside ultrasound confirms patient's baby is breech position. Patient will call back with decision on wether to proceed with version or planned c-section. Kathrene Alu RN

## 2020-01-24 ENCOUNTER — Inpatient Hospital Stay (HOSPITAL_COMMUNITY)
Admission: AD | Admit: 2020-01-24 | Discharge: 2020-01-24 | Disposition: A | Discharge status: Home / Self Care | Payer: Medicaid Other | Attending: Obstetrics & Gynecology | Admitting: Obstetrics & Gynecology

## 2020-01-24 ENCOUNTER — Encounter (HOSPITAL_COMMUNITY): Payer: Self-pay | Admitting: Obstetrics & Gynecology

## 2020-01-24 ENCOUNTER — Telehealth: Payer: Self-pay

## 2020-01-24 DIAGNOSIS — D696 Thrombocytopenia, unspecified: Secondary | ICD-10-CM

## 2020-01-24 DIAGNOSIS — O1490 Unspecified pre-eclampsia, unspecified trimester: Secondary | ICD-10-CM

## 2020-01-24 DIAGNOSIS — O368130 Decreased fetal movements, third trimester, not applicable or unspecified: Secondary | ICD-10-CM | POA: Insufficient documentation

## 2020-01-24 DIAGNOSIS — O321XX0 Maternal care for breech presentation, not applicable or unspecified: Secondary | ICD-10-CM | POA: Diagnosis not present

## 2020-01-24 DIAGNOSIS — Z3A36 36 weeks gestation of pregnancy: Secondary | ICD-10-CM | POA: Insufficient documentation

## 2020-01-24 DIAGNOSIS — R030 Elevated blood-pressure reading, without diagnosis of hypertension: Secondary | ICD-10-CM | POA: Diagnosis present

## 2020-01-24 DIAGNOSIS — Z3400 Encounter for supervision of normal first pregnancy, unspecified trimester: Secondary | ICD-10-CM

## 2020-01-24 DIAGNOSIS — O1493 Unspecified pre-eclampsia, third trimester: Secondary | ICD-10-CM

## 2020-01-24 DIAGNOSIS — O99119 Other diseases of the blood and blood-forming organs and certain disorders involving the immune mechanism complicating pregnancy, unspecified trimester: Secondary | ICD-10-CM

## 2020-01-24 DIAGNOSIS — Z3689 Encounter for other specified antenatal screening: Secondary | ICD-10-CM

## 2020-01-24 DIAGNOSIS — O99113 Other diseases of the blood and blood-forming organs and certain disorders involving the immune mechanism complicating pregnancy, third trimester: Secondary | ICD-10-CM | POA: Insufficient documentation

## 2020-01-24 LAB — COMPREHENSIVE METABOLIC PANEL
ALT: 17 U/L (ref 0–44)
AST: 20 U/L (ref 15–41)
Albumin: 2.5 g/dL — ABNORMAL LOW (ref 3.5–5.0)
Alkaline Phosphatase: 213 U/L — ABNORMAL HIGH (ref 38–126)
Anion gap: 10 (ref 5–15)
BUN: 9 mg/dL (ref 6–20)
CO2: 17 mmol/L — ABNORMAL LOW (ref 22–32)
Calcium: 8.4 mg/dL — ABNORMAL LOW (ref 8.9–10.3)
Chloride: 108 mmol/L (ref 98–111)
Creatinine, Ser: 0.55 mg/dL (ref 0.44–1.00)
GFR calc Af Amer: >60 mL/min (ref >60)
GFR calc non Af Amer: >60 mL/min (ref >60)
Glucose, Bld: 105 mg/dL — ABNORMAL HIGH (ref 70–99)
Potassium: 4.2 mmol/L (ref 3.5–5.1)
Sodium: 135 mmol/L (ref 135–145)
Total Bilirubin: 0.5 mg/dL (ref 0.3–1.2)
Total Protein: 5.7 g/dL — ABNORMAL LOW (ref 6.5–8.1)

## 2020-01-24 LAB — CBC
HCT: 32.7 % — ABNORMAL LOW (ref 36.0–46.0)
Hemoglobin: 10.5 g/dL — ABNORMAL LOW (ref 12.0–15.0)
MCH: 27.2 pg (ref 26.0–34.0)
MCHC: 32.1 g/dL (ref 30.0–36.0)
MCV: 84.7 fL (ref 80.0–100.0)
Platelets: 141 10*3/uL — ABNORMAL LOW (ref 150–400)
RBC: 3.86 MIL/uL — ABNORMAL LOW (ref 3.87–5.11)
RDW: 14.1 % (ref 11.5–15.5)
WBC: 6.5 10*3/uL (ref 4.0–10.5)
nRBC: 0 % (ref 0.0–0.2)

## 2020-01-24 LAB — PROTEIN / CREATININE RATIO, URINE
Creatinine, Urine: 19.1 mg/dL
Protein Creatinine Ratio: 2.62 mg/mg{Cre} — ABNORMAL HIGH (ref 0.00–0.15)
Total Protein, Urine: 50 mg/dL

## 2020-01-24 NOTE — Telephone Encounter (Signed)
Pt called the office stating she was seen in the office yesterday and was told that her baby is breech. Pt states she does not want to have ECV . Pt also states that her BP was 140/100 twice this morning and the 3rd reading was 140/80. I advised pt to go to [facility] at [facility] because of the two elevated BP readings. Understanding was voiced. Vladislav Axelson l Kynzlee Hucker, CMA

## 2020-01-24 NOTE — Discharge Instructions (Signed)
Preeclampsia and Eclampsia Preeclampsia is a serious condition that may develop during pregnancy. This condition causes high blood pressure and increased protein in your urine along with other symptoms, such as headaches and vision changes. These symptoms may develop as the condition gets worse. Preeclampsia may occur at 20 weeks of pregnancy or later. Diagnosing and treating preeclampsia early is very important. If not treated early, it can cause serious problems for you and your baby. One problem it can lead to is eclampsia. Eclampsia is a condition that causes muscle jerking or shaking (convulsions or seizures) and other serious problems for the mother. During pregnancy, delivering your baby may be the best treatment for preeclampsia or eclampsia. For most women, preeclampsia and eclampsia symptoms go away after giving birth. In rare cases, a woman may develop preeclampsia after giving birth (postpartum preeclampsia). This usually occurs within 48 hours after childbirth but may occur up to 6 weeks after giving birth. What are the causes? The cause of preeclampsia is not known. What increases the risk? The following risk factors make you more likely to develop preeclampsia:  Being pregnant for the first time.  Having had preeclampsia during a past pregnancy.  Having a family history of preeclampsia.  Having high blood pressure.  Being pregnant with more than one baby.  Being 35 or older.  Being African-American.  Having kidney disease or diabetes.  Having medical conditions such as lupus or blood diseases.  Being very overweight (obese). What are the signs or symptoms? The most common symptoms are:  Severe headaches.  Vision problems, such as blurred or double vision.  Abdominal pain, especially upper abdominal pain. Other symptoms that may develop as the condition gets worse include:  Sudden weight gain.  Sudden swelling of the hands, face, legs, and feet.  Severe nausea  and vomiting.  Numbness in the face, arms, legs, and feet.  Dizziness.  Urinating less than usual.  Slurred speech.  Convulsions or seizures. How is this diagnosed? There are no screening tests for preeclampsia. Your health care provider will ask you about symptoms and check for signs of preeclampsia during your prenatal visits. You may also have tests that include:  Checking your blood pressure.  Urine tests to check for protein. Your health care provider will check for this at every prenatal visit.  Blood tests.  Monitoring your baby's heart rate.  Ultrasound. How is this treated? You and your health care provider will determine the treatment approach that is best for you. Treatment may include:  Having more frequent prenatal exams to check for signs of preeclampsia, if you have an increased risk for preeclampsia.  Medicine to lower your blood pressure.  Staying in the hospital, if your condition is severe. There, treatment will focus on controlling your blood pressure and the amount of fluids in your body (fluid retention).  Taking medicine (magnesium sulfate) to prevent seizures. This may be given as an injection or through an IV.  Taking a low-dose aspirin during your pregnancy.  Delivering your baby early. You may have your labor started with medicine (induced), or you may have a cesarean delivery. Follow these instructions at home: Eating and drinking   Drink enough fluid to keep your urine pale yellow.  Avoid caffeine. Lifestyle  Do not use any products that contain nicotine or tobacco, such as cigarettes and e-cigarettes. If you need help quitting, ask your health care provider.  Do not use alcohol or drugs.  Avoid stress as much as possible. Rest and get   plenty of sleep. General instructions  Take over-the-counter and prescription medicines only as told by your health care provider.  When lying down, lie on your left side. This keeps pressure off your  major blood vessels.  When sitting or lying down, raise (elevate) your feet. Try putting some pillows underneath your lower legs.  Exercise regularly. Ask your health care provider what kinds of exercise are best for you.  Keep all follow-up and prenatal visits as told by your health care provider. This is important. How is this prevented? There is no known way of preventing preeclampsia or eclampsia from developing. However, to lower your risk of complications and detect problems early:  Get regular prenatal care. Your health care provider may be able to diagnose and treat the condition early.  Maintain a healthy weight. Ask your health care provider for help managing weight gain during pregnancy.  Work with your health care provider to manage any long-term (chronic) health conditions you have, such as diabetes or kidney problems.  You may have tests of your blood pressure and kidney function after giving birth.  Your health care provider may have you take low-dose aspirin during your next pregnancy. Contact a health care provider if:  You have symptoms that your health care provider told you may require more treatment or monitoring, such as: ? Headaches. ? Nausea or vomiting. ? Abdominal pain. ? Dizziness. ? Light-headedness. Get help right away if:  You have severe: ? Abdominal pain. ? Headaches that do not get better. ? Dizziness. ? Vision problems. ? Confusion. ? Nausea or vomiting.  You have any of the following: ? A seizure. ? Sudden, rapid weight gain. ? Sudden swelling in your hands, ankles, or face. ? Trouble moving any part of your body. ? Numbness in any part of your body. ? Trouble speaking. ? Abnormal bleeding.  You faint. Summary  Preeclampsia is a serious condition that may develop during pregnancy.  This condition causes high blood pressure and increased protein in your urine along with other symptoms, such as headaches and vision  changes.  Diagnosing and treating preeclampsia early is very important. If not treated early, it can cause serious problems for you and your baby.  Get help right away if you have symptoms that your health care provider told you to watch for. This information is not intended to replace advice given to you by your health care provider. Make sure you discuss any questions you have with your health care provider. Document Revised: 07/11/2018 Document Reviewed: 06/14/2016 Elsevier Patient Education  2020 Elsevier Inc.  

## 2020-01-24 NOTE — MAU Note (Signed)
BP elevated at appt yesterday, rechecked today, elevated- called office, instructed to come in.  Denies HA, vision blurred for 2 months, denies epigastric pain, feet are swollen.

## 2020-01-24 NOTE — MAU Provider Note (Signed)
History     CSN: [id number]  Arrival date and time: 01/24/20 1108   First Provider Initiated Contact with Patient 01/24/20 1247      Chief Complaint  Patient presents with  . Hypertension  . Foot Swelling   [name] is a 29 y.o. G1P0 at 65w4dwho presents to MAU for preeclampsia evaluation after she took her BP at home this morning and it was 140/100. Pt reports she was also in the clinic yesterday for a visit and had elevated BP of 100/93, then 132/83.  Pt reports swelling in her feet, hands and face. Pt reports when she wakes up in the morning she feels pressure in her eyes because of the swelling. Pt reports the swelling started about 2 months ago. Pt also reports intermittent blurry vision around 2 months ago, that is not present at this time. Pt denies wearing glasses or other corrective eyewear. 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  Pt denies HA, seeing spots, N/V, epigastric pain, sudden weight gain. Pt denies chest pain and SOB.  Pt denies constipation, diarrhea, or urinary problems. Pt denies fever, chills, fatigue, sweating or changes in appetite. Pt denies dizziness, light-headedness, weakness.  Pt denies VB, ctx, LOF and reports good FM.  Current pregnancy problems? none Blood Type? B Positive Allergies? NKDA Current medications? PNV [facility]   OB History    Gravida  1   Para      Term      Preterm      AB      Living        SAB      TAB      Ectopic      Multiple      Live Births              Past Medical History:  Diagnosis Date  . [place]   . Internal hemorrhoids   . Pituitary microadenoma ([facility]     Past Surgical History:  Procedure Laterality Date  . HEMORROIDECTOMY      Family History  Problem Relation Age of Onset  . Diabetes Father      Social History   Tobacco Use  . Smoking status: Never Smoker  . Smokeless tobacco: Never Used  Substance Use Topics  . Alcohol use: Never  . Drug use: Never    Allergies: No Known Allergies  Medications Prior to Admission  Medication Sig Dispense Refill Last Dose  . AMBULATORY NON FORMULARY MEDICATION 1 Device by Other route once a week. Blood pressure cuff/Medium  Monitored Regularly at home ICD 10: Z34.90 LROB 1 kit 0 01/24/2020 at Unknown time  . Prenatal Vit-Fe Fumarate-FA (PRENATAL VITAMINS PO) Take by mouth.   01/24/2020 at Unknown time    Review of Systems  Constitutional: Negative for chills, diaphoresis, fatigue and fever.  Eyes: Positive for visual disturbance.  Respiratory: Negative for shortness of breath.   Cardiovascular: Negative for chest pain.  Gastrointestinal: Negative for abdominal pain, constipation, diarrhea, nausea and vomiting.  Genitourinary: Negative for dysuria, flank pain, frequency, pelvic pain, urgency, vaginal bleeding and vaginal discharge.  Musculoskeletal:       Swelling of feet/hands/face.  Neurological: Negative for dizziness, weakness, light-headedness and headaches.   Physical Exam   Blood pressure 137/88, pulse 80, temperature 98.8 F (37.1 C),  temperature source Oral, resp. rate 16, height '5\' 6"'  (1.676 m), weight 86.6 kg, last menstrual period 05/13/2019, SpO2 99 %.  Patient Vitals for the past 24 hrs:  BP Temp Temp src Pulse Resp SpO2 Height Weight  01/24/20 1601 137/88 -- -- 80 -- 99 % -- --  01/24/20 1546 (!) 138/92 -- -- 82 -- 100 % -- --  01/24/20 1531 (!) 143/94 -- -- 80 -- 100 % -- --  01/24/20 1516 (!) 149/94 -- -- 80 -- -- -- --  01/24/20 1501 (!) 144/89 -- -- 80 -- 98 % -- --  01/24/20 1446 (!) 145/100 -- -- 83 -- 99 % -- --  01/24/20 1431 (!) 135/91 -- -- 78 -- 99 % -- --  01/24/20 1426 -- -- -- -- -- 100 % -- --  01/24/20 1421 -- -- -- -- -- 100 % -- --  01/24/20 1416 (!) 135/91 -- -- 83 -- 100 % -- --  01/24/20  1411 -- -- -- -- -- 98 % -- --  01/24/20 1401 (!) 141/93 -- -- 82 -- 100 % -- --  01/24/20 1356 -- -- -- -- -- 100 % -- --  01/24/20 1351 -- -- -- -- -- 100 % -- --  01/24/20 1346 (!) 145/94 -- -- 78 -- 99 % -- --  01/24/20 1341 -- -- -- -- -- 100 % -- --  01/24/20 1336 -- -- -- -- -- 99 % -- --  01/24/20 1331 (!) 147/95 -- -- 79 -- 100 % -- --  01/24/20 1326 -- -- -- -- -- 100 % -- --  01/24/20 1316 (!) 150/96 -- -- 83 -- 99 % -- --  01/24/20 1301 (!) 144/95 -- -- 78 -- -- -- --  01/24/20 1248 (!) 147/98 -- -- 74 -- -- -- --  01/24/20 1241 137/87 -- -- 82 -- 98 % -- --  01/24/20 1156 (!) 135/95 -- -- 85 -- 99 % -- --  01/24/20 1124 (!) 153/95 98.8 F (37.1 C) Oral 89 16 99 % '5\' 6"'  (1.676 m) 86.6 kg   Physical Exam  Constitutional: She is oriented to person, place, and time. She appears well-developed and well-nourished. No distress.  HENT:  Head: Normocephalic and atraumatic.  Respiratory: Effort normal.  GI: Soft. She exhibits no distension and no mass. There is no abdominal tenderness. There is no rebound and no guarding.  Neurological: She is alert and oriented to person, place, and time.  Skin: Skin is warm and dry. She is not diaphoretic.  Psychiatric: She has a normal mood and affect. Her behavior is normal. Judgment and thought content normal.   Results for orders placed or performed during the hospital encounter of 01/24/20 (from the past 24 hour(s))  CBC     Status: Abnormal   Collection Time: 01/24/20 11:48 AM  Result Value Ref Range   WBC 6.5 4.0 - 10.5 K/uL   RBC 3.86 (L) 3.87 - 5.11 MIL/uL   Hemoglobin 10.5 (L) 12.0 - 15.0 g/dL   HCT 32.7 (L) 36.0 - 46.0 %   MCV 84.7 80.0 - 100.0 fL   MCH 27.2 26.0 - 34.0 pg   MCHC 32.1 30.0 - 36.0 g/dL   RDW 14.1 11.5 - 15.5 %   Platelets 141 (L) 150 - 400 K/uL   nRBC 0.0 0.0 - 0.2 %  Comprehensive metabolic panel     Status: Abnormal   Collection Time: 01/24/20 11:48 AM  Result Value Ref Range  Sodium 135 135 - 145 mmol/L    Potassium 4.2 3.5 - 5.1 mmol/L   Chloride 108 98 - 111 mmol/L   CO2 17 (L) 22 - 32 mmol/L   Glucose, Bld 105 (H) 70 - 99 mg/dL   BUN 9 6 - 20 mg/dL   Creatinine, Ser 0.55 0.44 - 1.00 mg/dL   Calcium 8.4 (L) 8.9 - 10.3 mg/dL   Total Protein 5.7 (L) 6.5 - 8.1 g/dL   Albumin 2.5 (L) 3.5 - 5.0 g/dL   AST 20 15 - 41 U/L   ALT 17 0 - 44 U/L   Alkaline Phosphatase 213 (H) 38 - 126 U/L   Total Bilirubin 0.5 0.3 - 1.2 mg/dL   GFR calc non Af Amer >60 >60 mL/min   GFR calc Af Amer >60 >60 mL/min   Anion gap 10 5 - 15  Protein / creatinine ratio, urine     Status: Abnormal   Collection Time: 01/24/20 12:48 PM  Result Value Ref Range   Creatinine, Urine 19.10 mg/dL   Total Protein, Urine 50 mg/dL   Protein Creatinine Ratio 2.62 (H) 0.00 - 0.15 mg/mg[Cre]    MAU Course  Procedures  MDM -preeclampsia evaluation with elevated BP, blurry vision, swelling in feet/hands/face -CBC: H/H 10.5/32.7, platelets 141 -CMP: WNL for pregnancy -PCr: 2.62 -EFM: reactive       -baseline: 140       -variability: moderate       -accels: present, 15x15       -decels: absent       -TOCO: no ctx -consulted with Dr. Harolyn Rutherford, pt OK to be discharged home with BP check tomorrow [facility]'  with delivery scheduled for Sunday via C/S with or without attempted version previously -pt counseled by Dr. Darron Doom on version 01/23/2020, pt today desires PCS and does not desire to try ECV -pt discharged to home in stable condition  Orders Placed This Encounter  Procedures  . CBC    Standing Status:   Standing    Number of Occurrences:   1  . Comprehensive metabolic panel    Standing Status:   Standing    Number of Occurrences:   1  . Protein / creatinine ratio, urine    Standing Status:   Standing    Number of Occurrences:   1  . Diet NPO time specified    Standing Status:   Standing    Number of Occurrences:   1  . Discharge patient    Order Specific Question:   Discharge disposition    Answer:   01-Home or  Self Care [1]    Order Specific Question:   Discharge patient date    Answer:   01/24/2020    Assessment and Plan   1. Pre-eclampsia in third trimester   2. Decreased fetal movements in third trimester, single or unspecified fetus   3. Supervision of normal first pregnancy, antepartum   4. [redacted] weeks gestation of pregnancy   5. NST (non-stress test) reactive   6. Thrombocytopenia affecting pregnancy (HCC)    Allergies as of 01/24/2020   No Known Allergies     Medication List    TAKE these medications   AMBULATORY NON FORMULARY MEDICATION 1 Device by Other route once a week. Blood pressure cuff/Medium  Monitored Regularly at home ICD 10: Z34.90 LROB   PRENATAL VITAMINS PO Take by mouth.      -message sent to [facility] with high importance to schedule patient for BP check tomorrow,  pt advised to call when the office opens early tomorrow AM if she does not near from them tonight -C/S scheduled '@37wks'  for Sunday 01/27/2020 for 0930AM -discussed s/sx of preeclampsia including severe range BP and when to return to MAU -strict return MAU precautions given -pt discharged to home in stable condition  Elmyra Ricks E Carisma Troupe 01/24/2020, 4:34 PM

## 2020-01-25 ENCOUNTER — Ambulatory Visit (INDEPENDENT_AMBULATORY_CARE_PROVIDER_SITE_OTHER): Payer: Medicaid Other | Admitting: Family Medicine

## 2020-01-25 ENCOUNTER — Encounter: Payer: Self-pay | Admitting: Family Medicine

## 2020-01-25 ENCOUNTER — Encounter (HOSPITAL_COMMUNITY): Payer: Self-pay | Admitting: Anesthesiology

## 2020-01-25 ENCOUNTER — Other Ambulatory Visit: Payer: Self-pay

## 2020-01-25 ENCOUNTER — Encounter (HOSPITAL_COMMUNITY): Payer: Self-pay

## 2020-01-25 VITALS — BP 143/88 | HR 92 | Wt 188.0 lb

## 2020-01-25 DIAGNOSIS — O1493 Unspecified pre-eclampsia, third trimester: Secondary | ICD-10-CM

## 2020-01-25 DIAGNOSIS — O321XX0 Maternal care for breech presentation, not applicable or unspecified: Secondary | ICD-10-CM

## 2020-01-25 DIAGNOSIS — Z3400 Encounter for supervision of normal first pregnancy, unspecified trimester: Secondary | ICD-10-CM

## 2020-01-25 DIAGNOSIS — Z3A36 36 weeks gestation of pregnancy: Secondary | ICD-10-CM

## 2020-01-25 DIAGNOSIS — Z3403 Encounter for supervision of normal first pregnancy, third trimester: Secondary | ICD-10-CM

## 2020-01-25 LAB — CERVICOVAGINAL ANCILLARY ONLY
Chlamydia: NEGATIVE
Comment: NEGATIVE
Comment: NORMAL
Neisseria Gonorrhea: NEGATIVE

## 2020-01-25 NOTE — Progress Notes (Signed)
   PRENATAL VISIT NOTE  Subjective:  Sandra Smith is a 29 y.o. G1P0 at [redacted]w[redacted]d being seen today for ongoing prenatal care.  She is currently monitored for the following issues for this high-risk pregnancy and has Supervision of normal first pregnancy, antepartum; Pituitary adenoma ([place]); Hyperprolactinemia ([place]); Vitamin D deficiency; Decreased fetal movement; Preeclampsia; and Thrombocytopenia affecting pregnancy ([place]) on their problem list.  Patient reports no complaints.  Contractions: Not present. Vag. Bleeding: None.  Movement: Present. Denies leaking of fluid.   The following portions of the patient's history were reviewed and updated as appropriate: allergies, current medications, past family history, past medical history, past social history, past surgical history and problem list.   Objective:   Vitals:   01/25/20 0836 01/25/20 0838  BP: (!) 147/102 (!) 143/88  Pulse: (!) 102 92  Weight: 188 lb (85.3 kg)     Fetal Status: Fetal Heart Rate (bpm): 147   Movement: Present     General:  Alert, oriented and cooperative. Patient is in no acute distress.  Skin: Skin is warm and dry. No rash noted.   Cardiovascular: Normal heart rate noted  Respiratory: Normal respiratory effort, no problems with respiration noted  Abdomen: Soft, gravid, appropriate for gestational age.  Pain/Pressure: Absent     Pelvic: Cervical exam deferred        Extremities: Normal range of motion.  Edema: Mild pitting, slight indentation  Mental Status: Normal mood and affect. Normal behavior. Normal judgment and thought content.   Assessment and Plan:  Pregnancy: G1P0 at [redacted]w[redacted]d 1. Supervision of normal first pregnancy, antepartum FHT and FH normal  2. Pre-eclampsia in third trimester No severe features  3. Spontaneous breech delivery, single or unspecified fetus Patient is scheduled for c/s on Sunday (37wks). Discussed potential for version - patient hesitant because her aunt had a ECV attempted, which was  unsuccessful and told her it was painful. Offered spinal analgesia for the procedure. She will think about it.   Preterm labor symptoms and general obstetric precautions including but not limited to vaginal bleeding, contractions, leaking of fluid and fetal movement were reviewed in detail with the patient. Please refer to After Visit Summary for other counseling recommendations.   Return in about 10 days (around 02/04/2020) for NV BP check.  Future Appointments  Date Time Provider [place]  02/04/2020 10:30 AM Lavonia Drafts, MD [facility] None  02/28/2020 10:45 AM Lavonia Drafts, MD [facility] None    Truett Mainland, DO

## 2020-01-25 NOTE — H&P (View-Only) (Signed)
  Sandra Smith is an 29 y.o. G1P0 [redacted]w[redacted]d female.   Chief Complaint: elevated BP HPI: for delivery due to pre-eclampsia at 37 wks. Noted to be breech. Declines attempts at ECV.  Past Medical History:  Diagnosis Date  . Hyperprolactinemia ([place])   . Internal hemorrhoids   . Pituitary microadenoma [facility])     Past Surgical History:  Procedure Laterality Date  . HEMORROIDECTOMY      Family History  Problem Relation Age of Onset  . Diabetes Father    Social History:  reports that she has never smoked. She has never used smokeless tobacco. She reports that she does not drink alcohol or use drugs.   No Known Allergies  No medications prior to admission.     A comprehensive review of systems was negative.  Blood pressure (!) 147/93, pulse 82, temperature 98.1 F (36.7 C), temperature source Oral, resp. rate 16, height 5\' 6"  (1.676 m), weight 86.6 kg, last menstrual period 05/13/2019, SpO2 100 %. BP (!) 147/93   Pulse 82   Temp 98.1 F (36.7 C) (Oral)   Resp 16   Ht 5\' 6"  (1.676 m)   Wt 86.6 kg   LMP 05/13/2019 (Exact Date)   SpO2 100%   BMI 30.83 kg/m  General appearance: alert, cooperative and appears stated age Head: Normocephalic, without obvious abnormality, atraumatic Neck: supple, symmetrical, trachea midline Lungs: normal effort Heart: regular rate and rhythm Abdomen: gravid, non-tender Extremities: edema 3+ Skin: Skin color, texture, turgor normal. No rashes or lesions Neurologic: Grossly normal   Lab Results  Component Value Date   WBC 6.5 01/24/2020   HGB 10.5 (L) 01/24/2020   HCT 32.7 (L) 01/24/2020   MCV 84.7 01/24/2020   PLT 141 (L) 01/24/2020         ABO, Rh: B/Positive/-- (09/08 1114)  Antibody: Negative (09/08 1114)  Rubella: 24.40 (09/08 1114)  RPR: Non Reactive (01/12 0843)  HBsAg: Negative (09/08 1114)  HIV: Non Reactive (01/12 0843)  GBS:       Assessment/Plan Principal Problem:   Preeclampsia Active Problems:   Thrombocytopenia  affecting pregnancy [facility])   Breech presentation  For Primary C-section. Declines ECV. Risks include but are not limited to bleeding, infection, injury to surrounding structures, including bowel, bladder and ureters, blood clots, and death.  Likelihood of success is high.  Sandra Smith 01/25/2020, 3:50 PM

## 2020-01-25 NOTE — Pre-Procedure Instructions (Signed)
Interpreter number [phone]

## 2020-01-25 NOTE — H&P (Signed)
  Sandra Smith is an 29 y.o. G1P0 [redacted]w[redacted]d female.   Chief Complaint: elevated BP HPI: for delivery due to pre-eclampsia at 37 wks. Noted to be breech. Declines attempts at ECV.  Past Medical History:  Diagnosis Date  . Hyperprolactinemia ([place])   . Internal hemorrhoids   . 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 HEMORROIDECTOMY      Family History  Problem Relation Age of Onset  . Diabetes Father    Social History:  reports that she has never smoked. She has never used smokeless tobacco. She reports that she does not drink alcohol or use drugs.   No Known Allergies  No medications prior to admission.     A comprehensive review of systems was negative.  Blood pressure (!) 147/93, pulse 82, temperature 98.1 F (36.7 C), temperature source Oral, resp. rate 16, height 5\' 6"  (1.676 m), weight 86.6 kg, last menstrual period 05/13/2019, SpO2 100 %. BP (!) 147/93   Pulse 82   Temp 98.1 F (36.7 C) (Oral)   Resp 16   Ht 5\' 6"  (1.676 m)   Wt 86.6 kg   LMP 05/13/2019 (Exact Date)   SpO2 100%   BMI 30.83 kg/m  General appearance: alert, cooperative and appears stated age Head: Normocephalic, without obvious abnormality, atraumatic Neck: supple, symmetrical, trachea midline Lungs: normal effort Heart: regular rate and rhythm Abdomen: gravid, non-tender Extremities: edema 3+ Skin: Skin color, texture, turgor normal. No rashes or lesions Neurologic: Grossly normal   Lab Results  Component Value Date   WBC 6.5 01/24/2020   HGB 10.5 (L) 01/24/2020   HCT 32.7 (L) 01/24/2020   MCV 84.7 01/24/2020   PLT 141 (L) 01/24/2020         ABO, Rh: B/Positive/-- (09/08 1114)  Antibody: Negative (09/08 1114)  Rubella: 24.40 (09/08 1114)  RPR: Non Reactive (01/12 0843)  HBsAg: Negative (09/08 1114)  HIV: Non Reactive (01/12 0843)  GBS:       Assessment/Plan Principal Problem:   Preeclampsia Active Problems:   Thrombocytopenia  affecting pregnancy [facility])   Breech presentation  For Primary C-section. Declines ECV. Risks include but are not limited to bleeding, infection, injury to surrounding structures, including bowel, bladder and ureters, blood clots, and death.  Likelihood of success is high.  Sandra Smith 01/25/2020, 3:50 PM

## 2020-01-25 NOTE — Patient Instructions (Signed)
[place]  01/25/2020   Your procedure is scheduled on:  01/27/2020  Arrive at [place] at Entrance C on [address] at [facility]  and [employer] [employer] [address]. You are invited to use the FREE valet parking or use the Visitor's parking deck.  Pick up the phone at the desk and dial [phone].  Call this number if you have problems the morning of surgery: [phone]  Remember:   Do not eat food:(After Midnight) Desps de medianoche.  Do not drink clear liquids: (After Midnight) Desps de medianoche.  Take these medicines the morning of surgery with A SIP OF WATER:  none   Do not wear jewelry, make-up or nail polish.  Do not wear lotions, powders, or perfumes. Do not wear deodorant.  Do not shave 48 hours prior to surgery.  Do not bring valuables to the hospital.  [facility] is not   responsible for any belongings or valuables brought to the hospital.  Contacts, dentures or bridgework may not be worn into surgery.  Leave suitcase in the car. After surgery it may be brought to your room.  For patients admitted to the hospital, checkout time is 11:00 AM the day of              discharge.      Please read over the following fact sheets that you were given:     Preparing for Surgery

## 2020-01-26 ENCOUNTER — Other Ambulatory Visit (HOSPITAL_COMMUNITY)
Admission: RE | Admit: 2020-01-26 | Discharge: 2020-01-26 | Disposition: A | Discharge status: Home / Self Care | Payer: Medicaid Other | Source: Ambulatory Visit | Attending: Family Medicine | Admitting: Family Medicine

## 2020-01-26 ENCOUNTER — Other Ambulatory Visit: Payer: Self-pay

## 2020-01-26 LAB — TYPE AND SCREEN
ABO/RH(D): B POS
Antibody Screen: NEGATIVE

## 2020-01-26 LAB — CBC
HCT: 34 % — ABNORMAL LOW (ref 36.0–46.0)
Hemoglobin: 10.9 g/dL — ABNORMAL LOW (ref 12.0–15.0)
MCH: 27.5 pg (ref 26.0–34.0)
MCHC: 32.1 g/dL (ref 30.0–36.0)
MCV: 85.9 fL (ref 80.0–100.0)
Platelets: 142 K/uL — ABNORMAL LOW (ref 150–400)
RBC: 3.96 MIL/uL (ref 3.87–5.11)
RDW: 14 % (ref 11.5–15.5)
WBC: 6.2 K/uL (ref 4.0–10.5)
nRBC: 0 % (ref 0.0–0.2)

## 2020-01-26 LAB — RPR: RPR Ser Ql: NONREACTIVE

## 2020-01-26 LAB — SARS CORONAVIRUS 2 (TAT 6-24 HRS): SARS Coronavirus 2: NEGATIVE

## 2020-01-26 LAB — ABO/RH: ABO/RH(D): B POS

## 2020-01-26 NOTE — MAU Note (Addendum)
Patient here for pre-op lab work and covid swab. Denies any symptoms at this time. [place] and paperwork both given and reviewed. Questions answered. Patient informed of need to keep blue blood bank bracelet on; patient verbalized understanding.  Patient declined use of interpreter for this nurse interaction.

## 2020-01-27 ENCOUNTER — Encounter (HOSPITAL_COMMUNITY): Payer: Self-pay | Admitting: Family Medicine

## 2020-01-27 ENCOUNTER — Inpatient Hospital Stay (HOSPITAL_COMMUNITY): Payer: Medicaid Other | Admitting: Anesthesiology

## 2020-01-27 ENCOUNTER — Other Ambulatory Visit: Payer: Self-pay

## 2020-01-27 ENCOUNTER — Encounter (HOSPITAL_COMMUNITY)
Admission: RE | Disposition: A | Discharge status: Home / Self Care | Payer: Self-pay | Source: Home / Self Care | Attending: Family Medicine

## 2020-01-27 ENCOUNTER — Inpatient Hospital Stay (HOSPITAL_COMMUNITY)
Admission: RE | Admit: 2020-01-27 | Discharge: 2020-01-30 | DRG: 787 | Disposition: A | Discharge status: Home / Self Care | Payer: Medicaid Other | Attending: Family Medicine | Admitting: Family Medicine

## 2020-01-27 DIAGNOSIS — O99284 Endocrine, nutritional and metabolic diseases complicating childbirth: Secondary | ICD-10-CM | POA: Diagnosis present

## 2020-01-27 DIAGNOSIS — Z3A37 37 weeks gestation of pregnancy: Secondary | ICD-10-CM | POA: Diagnosis not present

## 2020-01-27 DIAGNOSIS — D352 Benign neoplasm of pituitary gland: Secondary | ICD-10-CM | POA: Diagnosis present

## 2020-01-27 DIAGNOSIS — E221 Hyperprolactinemia: Secondary | ICD-10-CM | POA: Diagnosis present

## 2020-01-27 DIAGNOSIS — O1494 Unspecified pre-eclampsia, complicating childbirth: Secondary | ICD-10-CM | POA: Diagnosis present

## 2020-01-27 DIAGNOSIS — O321XX0 Maternal care for breech presentation, not applicable or unspecified: Principal | ICD-10-CM | POA: Diagnosis present

## 2020-01-27 DIAGNOSIS — O9912 Other diseases of the blood and blood-forming organs and certain disorders involving the immune mechanism complicating childbirth: Secondary | ICD-10-CM | POA: Diagnosis present

## 2020-01-27 DIAGNOSIS — D649 Anemia, unspecified: Secondary | ICD-10-CM | POA: Diagnosis present

## 2020-01-27 DIAGNOSIS — D696 Thrombocytopenia, unspecified: Secondary | ICD-10-CM | POA: Diagnosis present

## 2020-01-27 DIAGNOSIS — Z3400 Encounter for supervision of normal first pregnancy, unspecified trimester: Secondary | ICD-10-CM

## 2020-01-27 DIAGNOSIS — O9902 Anemia complicating childbirth: Secondary | ICD-10-CM | POA: Diagnosis present

## 2020-01-27 DIAGNOSIS — Z20822 Contact with and (suspected) exposure to covid-19: Secondary | ICD-10-CM | POA: Diagnosis present

## 2020-01-27 DIAGNOSIS — O1490 Unspecified pre-eclampsia, unspecified trimester: Secondary | ICD-10-CM | POA: Diagnosis present

## 2020-01-27 LAB — CREATININE, SERUM
Creatinine, Ser: 0.62 mg/dL (ref 0.44–1.00)
GFR calc Af Amer: >60 mL/min (ref >60)
GFR calc non Af Amer: >60 mL/min (ref >60)

## 2020-01-27 LAB — CULTURE, BETA STREP (GROUP B ONLY): Strep Gp B Culture: NEGATIVE

## 2020-01-27 SURGERY — Surgical Case
Anesthesia: Spinal | Site: Abdomen

## 2020-01-27 SURGERY — Surgical Case
Anesthesia: Regional

## 2020-01-27 MED ORDER — SCOPOLAMINE 1 MG/3DAYS TD PT72: 1.0000 | MEDICATED_PATCH | Freq: Once | TRANSDERMAL | Status: DC

## 2020-01-27 MED ORDER — BUPIVACAINE HCL (PF) 0.25 % IJ SOLN
INTRAMUSCULAR | Status: AC
  Filled 2020-01-27: qty 30

## 2020-01-27 MED ORDER — ONDANSETRON HCL 4 MG/2ML IJ SOLN
INTRAMUSCULAR | Status: AC
  Filled 2020-01-27: qty 2

## 2020-01-27 MED ORDER — KETOROLAC TROMETHAMINE 30 MG/ML IJ SOLN
30.0000 mg | Freq: Four times a day (QID) | INTRAMUSCULAR | Status: AC
  Administered 2020-01-27 – 2020-01-28 (×3): 30 mg via INTRAVENOUS
  Filled 2020-01-27 (×4): qty 1

## 2020-01-27 MED ORDER — NALOXONE HCL 4 MG/10ML IJ SOLN
1.0000 ug/kg/h | INTRAVENOUS | Status: DC | PRN
  Filled 2020-01-27: qty 5

## 2020-01-27 MED ORDER — KETOROLAC TROMETHAMINE 30 MG/ML IJ SOLN: 30.0000 mg | Freq: Once | INTRAMUSCULAR | Status: DC

## 2020-01-27 MED ORDER — DIBUCAINE (PERIANAL) 1 % EX OINT: 1.0000 "application " | TOPICAL_OINTMENT | CUTANEOUS | Status: DC | PRN

## 2020-01-27 MED ORDER — MORPHINE SULFATE (PF) 0.5 MG/ML IJ SOLN
INTRAMUSCULAR | Status: DC | PRN
  Administered 2020-01-27: .15 mg via INTRATHECAL

## 2020-01-27 MED ORDER — SCOPOLAMINE 1 MG/3DAYS TD PT72
MEDICATED_PATCH | TRANSDERMAL | Status: DC | PRN
  Administered 2020-01-27: 1 via TRANSDERMAL

## 2020-01-27 MED ORDER — IBUPROFEN 800 MG PO TABS
800.0000 mg | ORAL_TABLET | Freq: Four times a day (QID) | ORAL | Status: DC
  Administered 2020-01-28 – 2020-01-30 (×8): 800 mg via ORAL
  Filled 2020-01-27 (×8): qty 1

## 2020-01-27 MED ORDER — SODIUM CHLORIDE 0.9% FLUSH: 3.0000 mL | INTRAVENOUS | Status: DC | PRN

## 2020-01-27 MED ORDER — NALBUPHINE HCL 10 MG/ML IJ SOLN: 5.0000 mg | Freq: Once | INTRAMUSCULAR | Status: DC | PRN

## 2020-01-27 MED ORDER — DEXAMETHASONE SODIUM PHOSPHATE 10 MG/ML IJ SOLN
INTRAMUSCULAR | Status: AC
  Filled 2020-01-27: qty 1

## 2020-01-27 MED ORDER — SIMETHICONE 80 MG PO CHEW
80.0000 mg | CHEWABLE_TABLET | ORAL | Status: DC
  Administered 2020-01-28 – 2020-01-29 (×3): 80 mg via ORAL
  Filled 2020-01-27 (×3): qty 1

## 2020-01-27 MED ORDER — TETANUS-DIPHTH-ACELL PERTUSSIS 5-2.5-18.5 LF-MCG/0.5 IM SUSP: 0.5000 mL | Freq: Once | INTRAMUSCULAR | Status: DC

## 2020-01-27 MED ORDER — HYDROMORPHONE HCL 1 MG/ML IJ SOLN: 0.2000 mg | INTRAMUSCULAR | Status: DC | PRN

## 2020-01-27 MED ORDER — ACETAMINOPHEN 500 MG PO TABS
1000.0000 mg | ORAL_TABLET | ORAL | Status: AC
  Administered 2020-01-27: 1000 mg via ORAL

## 2020-01-27 MED ORDER — CELECOXIB 200 MG PO CAPS
ORAL_CAPSULE | ORAL | Status: AC
  Filled 2020-01-27: qty 2

## 2020-01-27 MED ORDER — MENTHOL 3 MG MT LOZG: 1.0000 | LOZENGE | OROMUCOSAL | Status: DC | PRN

## 2020-01-27 MED ORDER — PROMETHAZINE HCL 25 MG/ML IJ SOLN: 6.2500 mg | INTRAMUSCULAR | Status: DC | PRN

## 2020-01-27 MED ORDER — BUPIVACAINE IN DEXTROSE 0.75-8.25 % IT SOLN
INTRATHECAL | Status: DC | PRN
  Administered 2020-01-27: 1.4 mL via INTRATHECAL

## 2020-01-27 MED ORDER — FENTANYL CITRATE (PF) 100 MCG/2ML IJ SOLN
INTRAMUSCULAR | Status: DC | PRN
  Administered 2020-01-27: 15 ug via INTRATHECAL

## 2020-01-27 MED ORDER — MEPERIDINE HCL 25 MG/ML IJ SOLN: 6.2500 mg | INTRAMUSCULAR | Status: DC | PRN

## 2020-01-27 MED ORDER — SODIUM CHLORIDE 0.9 % IV SOLN: INTRAVENOUS | Status: DC | PRN

## 2020-01-27 MED ORDER — DIPHENHYDRAMINE HCL 25 MG PO CAPS: 25.0000 mg | ORAL_CAPSULE | ORAL | Status: DC | PRN

## 2020-01-27 MED ORDER — FENTANYL CITRATE (PF) 100 MCG/2ML IJ SOLN: 25.0000 ug | INTRAMUSCULAR | Status: DC | PRN

## 2020-01-27 MED ORDER — PRENATAL MULTIVITAMIN CH
1.0000 | ORAL_TABLET | Freq: Every day | ORAL | Status: DC
  Administered 2020-01-28 – 2020-01-30 (×3): 1 via ORAL
  Filled 2020-01-27 (×3): qty 1

## 2020-01-27 MED ORDER — CELECOXIB 200 MG PO CAPS
400.0000 mg | ORAL_CAPSULE | ORAL | Status: AC
  Administered 2020-01-27: 400 mg via ORAL

## 2020-01-27 MED ORDER — FENTANYL CITRATE (PF) 100 MCG/2ML IJ SOLN
INTRAMUSCULAR | Status: AC
  Filled 2020-01-27: qty 2

## 2020-01-27 MED ORDER — CEFAZOLIN SODIUM-DEXTROSE 2-4 GM/100ML-% IV SOLN: 2.0000 g | INTRAVENOUS | Status: DC

## 2020-01-27 MED ORDER — OXYTOCIN 40 UNITS IN NORMAL SALINE INFUSION - SIMPLE MED
INTRAVENOUS | Status: DC | PRN
  Administered 2020-01-27: 40 [IU] via INTRAVENOUS

## 2020-01-27 MED ORDER — OXYTOCIN 10 UNIT/ML IJ SOLN
INTRAMUSCULAR | Status: AC
  Filled 2020-01-27: qty 1

## 2020-01-27 MED ORDER — NALBUPHINE HCL 10 MG/ML IJ SOLN: 5.0000 mg | INTRAMUSCULAR | Status: DC | PRN

## 2020-01-27 MED ORDER — ENOXAPARIN SODIUM 40 MG/0.4ML ~~LOC~~ SOLN
40.0000 mg | SUBCUTANEOUS | Status: DC
  Administered 2020-01-28 – 2020-01-30 (×3): 40 mg via SUBCUTANEOUS
  Filled 2020-01-27 (×3): qty 0.4

## 2020-01-27 MED ORDER — ONDANSETRON HCL 4 MG/2ML IJ SOLN: 4.0000 mg | Freq: Three times a day (TID) | INTRAMUSCULAR | Status: DC | PRN

## 2020-01-27 MED ORDER — PHENYLEPHRINE HCL-NACL 20-0.9 MG/250ML-% IV SOLN
INTRAVENOUS | Status: DC | PRN
  Administered 2020-01-27: 60 ug/min via INTRAVENOUS

## 2020-01-27 MED ORDER — ONDANSETRON HCL 4 MG/2ML IJ SOLN
INTRAMUSCULAR | Status: DC | PRN
  Administered 2020-01-27: 4 mg via INTRAVENOUS

## 2020-01-27 MED ORDER — SIMETHICONE 80 MG PO CHEW
80.0000 mg | CHEWABLE_TABLET | Freq: Three times a day (TID) | ORAL | Status: DC
  Administered 2020-01-27 – 2020-01-30 (×8): 80 mg via ORAL
  Filled 2020-01-27 (×8): qty 1

## 2020-01-27 MED ORDER — WITCH HAZEL-GLYCERIN EX PADS: 1.0000 "application " | MEDICATED_PAD | CUTANEOUS | Status: DC | PRN

## 2020-01-27 MED ORDER — LACTATED RINGERS IV SOLN: INTRAVENOUS | Status: DC | PRN

## 2020-01-27 MED ORDER — SENNOSIDES-DOCUSATE SODIUM 8.6-50 MG PO TABS
2.0000 | ORAL_TABLET | ORAL | Status: DC
  Administered 2020-01-28 (×2): 2 via ORAL
  Filled 2020-01-27 (×3): qty 2

## 2020-01-27 MED ORDER — NALOXONE HCL 0.4 MG/ML IJ SOLN: 0.4000 mg | INTRAMUSCULAR | Status: DC | PRN

## 2020-01-27 MED ORDER — CEFAZOLIN SODIUM-DEXTROSE 2-4 GM/100ML-% IV SOLN
INTRAVENOUS | Status: AC
  Filled 2020-01-27: qty 100

## 2020-01-27 MED ORDER — DIPHENHYDRAMINE HCL 50 MG/ML IJ SOLN: 12.5000 mg | INTRAMUSCULAR | Status: DC | PRN

## 2020-01-27 MED ORDER — BUPIVACAINE HCL (PF) 0.25 % IJ SOLN
INTRAMUSCULAR | Status: DC | PRN
  Administered 2020-01-27: 30 mL

## 2020-01-27 MED ORDER — DEXAMETHASONE SODIUM PHOSPHATE 4 MG/ML IJ SOLN
INTRAMUSCULAR | Status: DC | PRN
  Administered 2020-01-27: 8 mg via INTRAVENOUS

## 2020-01-27 MED ORDER — ACETAMINOPHEN 500 MG PO TABS
ORAL_TABLET | ORAL | Status: AC
  Filled 2020-01-27: qty 2

## 2020-01-27 MED ORDER — SIMETHICONE 80 MG PO CHEW: 80.0000 mg | CHEWABLE_TABLET | ORAL | Status: DC | PRN

## 2020-01-27 MED ORDER — LACTATED RINGERS IV SOLN
125.0000 mL/h | INTRAVENOUS | Status: DC
  Administered 2020-01-27: 125 mL/h via INTRAVENOUS

## 2020-01-27 MED ORDER — LACTATED RINGERS IV SOLN: INTRAVENOUS | Status: DC

## 2020-01-27 MED ORDER — MORPHINE SULFATE (PF) 0.5 MG/ML IJ SOLN
INTRAMUSCULAR | Status: AC
  Filled 2020-01-27: qty 10

## 2020-01-27 MED ORDER — DIPHENHYDRAMINE HCL 25 MG PO CAPS: 25.0000 mg | ORAL_CAPSULE | Freq: Four times a day (QID) | ORAL | Status: DC | PRN

## 2020-01-27 MED ORDER — OXYTOCIN 40 UNITS IN NORMAL SALINE INFUSION - SIMPLE MED: 2.5000 [IU]/h | INTRAVENOUS | Status: AC

## 2020-01-27 MED ORDER — COCONUT OIL OIL: 1.0000 "application " | TOPICAL_OIL | Status: DC | PRN

## 2020-01-27 MED ORDER — ACETAMINOPHEN 500 MG PO TABS
1000.0000 mg | ORAL_TABLET | Freq: Four times a day (QID) | ORAL | Status: DC
  Administered 2020-01-27 – 2020-01-30 (×11): 1000 mg via ORAL
  Filled 2020-01-27 (×13): qty 2

## 2020-01-27 MED ORDER — ZOLPIDEM TARTRATE 5 MG PO TABS: 5.0000 mg | ORAL_TABLET | Freq: Every evening | ORAL | Status: DC | PRN

## 2020-01-27 MED ORDER — CEFAZOLIN SODIUM-DEXTROSE 2-3 GM-%(50ML) IV SOLR
INTRAVENOUS | Status: DC | PRN
  Administered 2020-01-27: 2 g via INTRAVENOUS

## 2020-01-27 MED ORDER — OXYCODONE HCL 5 MG PO TABS: 5.0000 mg | ORAL_TABLET | ORAL | Status: DC | PRN

## 2020-01-27 SURGICAL SUPPLY — 36 items
BENZOIN TINCTURE PRP APPL 2/3 (GAUZE/BANDAGES/DRESSINGS) ×3 IMPLANT
CHLORAPREP W/TINT 26ML (MISCELLANEOUS) ×3 IMPLANT
CLAMP CORD UMBIL (MISCELLANEOUS) IMPLANT
CLOSURE STERI STRIP 1/2 X4 (GAUZE/BANDAGES/DRESSINGS) ×3 IMPLANT
CLOSURE WOUND 1/2 X4 (GAUZE/BANDAGES/DRESSINGS) ×2
CLOTH BEACON ORANGE TIMEOUT ST (SAFETY) ×3 IMPLANT
DRSG OPSITE POSTOP 4X10 (GAUZE/BANDAGES/DRESSINGS) ×3 IMPLANT
ELECT REM PT RETURN 9FT ADLT (ELECTROSURGICAL) ×3
ELECTRODE REM PT RTRN 9FT ADLT (ELECTROSURGICAL) ×1 IMPLANT
EXTRACTOR VACUUM M CUP 4 TUBE (SUCTIONS) IMPLANT
EXTRACTOR VACUUM M CUP 4' TUBE (SUCTIONS)
GAUZE SPONGE 4X4 12PLY STRL LF (GAUZE/BANDAGES/DRESSINGS) ×6 IMPLANT
GLOVE BIOGEL PI IND STRL 7.0 (GLOVE) ×2 IMPLANT
GLOVE BIOGEL PI INDICATOR 7.0 (GLOVE) ×4
GLOVE ECLIPSE 7.0 STRL STRAW (GLOVE) ×6 IMPLANT
GOWN STRL REUS W/TWL LRG LVL3 (GOWN DISPOSABLE) ×6 IMPLANT
KIT ABG SYR 3ML LUER SLIP (SYRINGE) IMPLANT
NEEDLE HYPO 22GX1.5 SAFETY (NEEDLE) ×3 IMPLANT
NEEDLE HYPO 25X5/8 SAFETYGLIDE (NEEDLE) IMPLANT
NS IRRIG 1000ML POUR BTL (IV SOLUTION) ×3 IMPLANT
PACK C SECTION WH (CUSTOM PROCEDURE TRAY) ×3 IMPLANT
PAD ABD 7.5X8 STRL (GAUZE/BANDAGES/DRESSINGS) ×3 IMPLANT
PAD OB MATERNITY 4.3X12.25 (PERSONAL CARE ITEMS) ×3 IMPLANT
PENCIL SMOKE EVAC W/HOLSTER (ELECTROSURGICAL) ×3 IMPLANT
RTRCTR C-SECT PINK 25CM LRG (MISCELLANEOUS) ×3 IMPLANT
SPONGE GAUZE 4X4 12PLY STER LF (GAUZE/BANDAGES/DRESSINGS) ×3 IMPLANT
STRIP CLOSURE SKIN 1/2X4 (GAUZE/BANDAGES/DRESSINGS) ×4 IMPLANT
SUT MNCRL 0 VIOLET CTX 36 (SUTURE) ×2 IMPLANT
SUT MONOCRYL 0 CTX 36 (SUTURE) ×4
SUT VIC AB 0 CTX 36 (SUTURE) ×2
SUT VIC AB 0 CTX36XBRD ANBCTRL (SUTURE) ×1 IMPLANT
SUT VIC AB 4-0 KS 27 (SUTURE) ×3 IMPLANT
SYR 30ML LL (SYRINGE) ×3 IMPLANT
TOWEL OR 17X24 6PK STRL BLUE (TOWEL DISPOSABLE) ×3 IMPLANT
TRAY FOLEY W/BAG SLVR 14FR LF (SET/KITS/TRAYS/PACK) ×3 IMPLANT
WATER STERILE IRR 1000ML POUR (IV SOLUTION) ×3 IMPLANT

## 2020-01-27 NOTE — H&P (Signed)
OBSTETRIC ADMISSION HISTORY AND PHYSICAL  Sandra Smith is a 29 y.o. female G1P0 with IUP at 87w0dpresenting for scheduled Cesarean section for breech presentation and pre-eclampsia. She reports +FMs. No LOF, VB, blurry vision, headaches, peripheral edema, or RUQ pain. She plans on breastfeeding. She declines birth control.  Dating: By 11 week [place]--->  Estimated Date of Delivery: 02/17/20  Sono:   '@[redacted]w[redacted]d' , normal anatomy, cephalic presentation, 2371I 54%ile, EFW 0#10  Prenatal History/Complications: Pre-eclampsia Thrombocytopenia Pituitary adenoma Hyperprolactinemia Vitamin D deficiency  Past Medical History: Past Medical History:  Diagnosis Date  . [place]   . Internal hemorrhoids   . Pituitary microadenoma ([facility]     Past Surgical History: Past Surgical History:  Procedure Laterality Date  . HEMORROIDECTOMY      Obstetrical History: OB History    Gravida  1   Para      Term      Preterm      AB      Living        SAB      TAB      Ectopic      Multiple      Live Births              Social History: Social History   Socioeconomic History  . Marital status: Single    Spouse name: Not on file  . Number of children: Not on file  . Years of education: Not on file  . Highest education level: Not on file  Occupational History  . Not on file  Tobacco Use  . Smoking status: Never Smoker  . Smokeless tobacco: Never Used  Substance and Sexual Activity  . Alcohol use: Never  . Drug use: Never  . Sexual activity: Not Currently    Birth control/protection: None  Other Topics Concern  . Not on file  Social History Narrative  . Not on file   Social Determinants of Health   Financial Resource Strain:   . Difficulty of Paying Living Expenses: Not on file  Food Insecurity:   . Worried About RCharity fundraiserin the Last Year: Not on file  . Ran Out of Food in the Last Year: Not on file  Transportation Needs:   . Lack of  Transportation (Medical): Not on file  . Lack of Transportation (Non-Medical): Not on file  Physical Activity:   . Days of Exercise per Week: Not on file  . Minutes of Exercise per Session: Not on file  Stress:   . Feeling of Stress : Not on file  Social Connections:   . Frequency of Communication with Friends and Family: Not on file  . Frequency of Social Gatherings with Friends and Family: Not on file  . Attends Religious Services: Not on file  . Active Member of Clubs or Organizations: Not on file  . Attends CArchivistMeetings: Not on file  . Marital Status: Not on file    Family History: Family History  Problem Relation Age of Onset  . Diabetes Father   . Heart disease Father     Allergies: No Known Allergies  Medications Prior to Admission  Medication Sig Dispense Refill Last Dose  . AMBULATORY NON FORMULARY MEDICATION 1 Device by Other route once a week. Blood pressure cuff/Medium  Monitored Regularly at home ICD 10: Z34.90 LROB 1 kit 0   . Prenatal Vit-Fe Fumarate-FA (PRENATAL VITAMINS PO) Take by mouth.        Review  of Systems:  All systems reviewed and negative except as stated in HPI  PE: Blood pressure (!) 150/99, temperature 98.6 F (37 C), temperature source Oral, resp. rate 18, height '5\' 7"'  (1.702 m), weight 86.6 kg, last menstrual period 05/13/2019. General appearance: alert and cooperative Lungs: regular rate and effort Heart: regular rate  Abdomen: soft, non-tender Extremities: Homans sign is negative, no sign of DVT Presentation: breech, confirmed by bedside US   Prenatal labs: ABO, Rh: --/--/B POS, B POS Performed at [facility], [place] [address] [place],  [address]  [phone]) Antibody: NEG (03/06 0810) Rubella: 24.40 (09/08 1114) RPR: NON REACTIVE (03/06 0810)  HBsAg: Negative (09/08 1114)  HIV: Non Reactive (01/12 0843)  GBS: Negative/-- (03/03 1316)  2 hr GTT 89/161/97  Prenatal Transfer Tool  Maternal  Diabetes: No Genetic Screening: Normal Maternal Ultrasounds/Referrals: Normal Fetal Ultrasounds or other Referrals:  None Maternal Substance Abuse:  No Significant Maternal Medications:  None Significant Maternal Lab Results: None  No results found for this or any previous visit (from the past 24 hour(s)).  Patient Active Problem List   Diagnosis Date Noted  . Breech presentation 01/25/2020  . Preeclampsia 01/24/2020  . Thrombocytopenia affecting pregnancy ([place]) 01/24/2020  . Decreased fetal movement 01/08/2020  . Supervision of normal first pregnancy, antepartum 07/31/2019  . Vitamin D deficiency 02/08/2019  . Pituitary adenoma ([place]) 04/08/2018  . Hyperprolactinemia ([place]) 04/08/2018    Assessment: Sandra Smith is a 29 y.o. G1P0 at 79w0dhere for primary Cesarean delivery in the context of breech presentation and pre-eclampsia.   Plan: The risks of cesarean section were discussed with the patient including but were not limited to: bleeding which may require transfusion or reoperation; infection which may require antibiotics; injury to bowel, bladder, ureters or other surrounding organs; injury to the fetus; need for additional procedures including hysterectomy in the event of a life-threatening hemorrhage; placental abnormalities wth subsequent pregnancies, incisional problems, thromboembolic phenomenon and other postoperative/anesthesia complications.    The patient concurred with the proposed plan, giving informed written consent for the procedures.  Patient has been NPO since last night she will remain NPO for procedure. Anesthesia and OR aware.  Preoperative prophylactic antibiotics and SCDs ordered on call to the OR.  To OR when ready.   Kaliana Albino L Devonta Blanford, DO  01/27/2020, 8:32 AM

## 2020-01-27 NOTE — Lactation Note (Signed)
This note was copied from a baby's chart. Lactation Consultation Note  Patient Name: Boy Cyndie Debolt [id number] Date: 01/27/2020   Attempted to visit with mom, [employer] student Sonia Side called the interpreter for Urdu [place], but as soon as the interpreter connected on the call, RN Tiara reported to [facility] that baby's oxygen saturation were between 87-93% and that this was not a good time to try BF.  RN Janeal Holmes has done hand expression with mom and with hand pump issuance, mom still decided to supplement with formula after she was educated on LEAD. Donor milk was offered but she declined. Baby will continue to be monitored for 02 saturations, LC will come back later tonight or tomorrow morning to check on mom and baby.    Maternal Data    Feeding Feeding Type: Breast Milk with Formula added Nipple Type: Extra Slow Flow  LATCH Score Latch: Repeated attempts needed to sustain latch, nipple held in mouth throughout feeding, stimulation needed to elicit sucking reflex.  Audible Swallowing: None  Type of Nipple: Everted at rest and after stimulation  Comfort (Breast/Nipple): Soft / non-tender  Hold (Positioning): Full assist, staff holds infant at breast  LATCH Score: 5  Interventions Interventions: Breast feeding basics reviewed;Hand express;Breast massage;Skin to skin;Support pillows;Expressed milk;Hand pump;Pre-pump if needed;Assisted with latch;Position options  Lactation Tools Discussed/Used     Consult Status      Daija Routson Francene Boyers 01/27/2020, 9:51 PM

## 2020-01-27 NOTE — Anesthesia Preprocedure Evaluation (Deleted)
Anesthesia Evaluation    Reviewed: Allergy & Precautions, Patient's Chart, lab work & pertinent test results  History of Anesthesia Complications Negative for: history of anesthetic complications  Airway        Dental   Pulmonary neg pulmonary ROS,           Cardiovascular hypertension,      Neuro/Psych negative neurological ROS     GI/Hepatic negative GI ROS, Neg liver ROS,   Endo/Other  negative endocrine ROS  Renal/GU negative Renal ROS     Musculoskeletal negative musculoskeletal ROS (+)   Abdominal   Peds  Hematology negative hematology ROS (+)   Anesthesia Other Findings Day of surgery medications reviewed with the patient.  Reproductive/Obstetrics (+) Pregnancy Preeclampsia                             Anesthesia Physical Anesthesia Plan  ASA: III  Anesthesia Plan: Spinal   Post-op Pain Management:    Induction:   PONV Risk Score and Plan: 3 and Ondansetron, Dexamethasone, Scopolamine patch - Pre-op and Treatment may vary due to age or medical condition  Airway Management Planned: Natural Airway  Additional Equipment:   Intra-op Plan:   Post-operative Plan:   Informed Consent:   Plan Discussed with: Anesthesiologist  Anesthesia Plan Comments:         Anesthesia Quick Evaluation

## 2020-01-27 NOTE — Transfer of Care (Signed)
Immediate Anesthesia Transfer of Care Note  Patient: Sandra Smith  Procedure(s) Performed: CESAREAN SECTION (N/A Abdomen)  Patient Location: PACU  Anesthesia Type:Spinal  Level of Consciousness: awake, alert  and oriented  Airway & Oxygen Therapy: Patient Spontanous Breathing  Post-op Assessment: Report given to RN and Post -op Vital signs reviewed and stable  Post vital signs: Reviewed and stable  Last Vitals:  Vitals Value Taken Time  BP    Temp    Pulse    Resp    SpO2      Last Pain:  Vitals:   01/27/20 0825  TempSrc: Oral  PainSc: 0-No pain         Complications: No apparent anesthesia complications

## 2020-01-27 NOTE — Anesthesia Postprocedure Evaluation (Signed)
Anesthesia Post Note  Patient: [employer]  Procedure(s) Performed: CESAREAN SECTION (N/A Abdomen)     Patient location during evaluation: PACU Anesthesia Type: Spinal Level of consciousness: awake and alert Pain management: pain level controlled Vital Signs Assessment: post-procedure vital signs reviewed and stable Respiratory status: spontaneous breathing and respiratory function stable Cardiovascular status: blood pressure returned to baseline and stable Postop Assessment: spinal receding Anesthetic complications: no    Last Vitals:  Vitals:   01/27/20 1212 01/27/20 1320  BP: (!) 138/96 (!) 143/90  Pulse: 77 67  Resp: 18 18  Temp: 37.3 C 36.8 C  SpO2: 99% 98%    Last Pain:  Vitals:   01/27/20 1325  TempSrc:   PainSc: 0-No pain   Pain Goal:                   Jonluke Cobbins DANIEL

## 2020-01-27 NOTE — H&P (Signed)
OBSTETRIC ADMISSION HISTORY AND PHYSICAL  Sandra Smith is a 29 y.o. female G1P0 with IUP at 30w0dpresenting for scheduled Cesarean section for breech presentation and pre-eclampsia. She reports +FMs. No LOF, VB, blurry vision, headaches, peripheral edema, or RUQ pain. She plans on breastfeeding. She declines birth control.  Dating: By 11 week [place]--->  Estimated Date of Delivery: 02/17/20  Sono:   _0 , normal anatomy, cephalic presentation, 2546T 54%ile, EFW 0#10  Prenatal History/Complications: Pre-eclampsia Thrombocytopenia Pituitary adenoma Hyperprolactinemia Vitamin D deficiency  Past Medical History: Past Medical History:  Diagnosis Date  . [place]   . Internal hemorrhoids   . Pituitary microadenoma ([facility]     Past Surgical History: Past Surgical History:  Procedure Laterality Date  . HEMORROIDECTOMY      Obstetrical History: OB History    Gravida  1   Para      Term      Preterm      AB      Living        SAB      TAB      Ectopic      Multiple      Live Births              Social History: Social History   Socioeconomic History  . Marital status: Single    Spouse name: Not on file  . Number of children: Not on file  . Years of education: Not on file  . Highest education level: Not on file  Occupational History  . Not on file  Tobacco Use  . Smoking status: Never Smoker  . Smokeless tobacco: Never Used  Substance and Sexual Activity  . Alcohol use: Never  . Drug use: Never  . Sexual activity: Not Currently    Birth control/protection: None  Other Topics Concern  . Not on file  Social History Narrative  . Not on file   Social Determinants of Health   Financial Resource Strain:   . Difficulty of Paying Living Expenses: Not on file  Food Insecurity:   . Worried About RCharity fundraiserin the Last Year: Not on file  . Ran Out of Food in the Last Year: Not on file  Transportation Needs:   . Lack of  Transportation (Medical): Not on file  . Lack of Transportation (Non-Medical): Not on file  Physical Activity:   . Days of Exercise per Week: Not on file  . Minutes of Exercise per Session: Not on file  Stress:   . Feeling of Stress : Not on file  Social Connections:   . Frequency of Communication with Friends and Family: Not on file  . Frequency of Social Gatherings with Friends and Family: Not on file  . Attends Religious Services: Not on file  . Active Member of Clubs or Organizations: Not on file  . Attends CArchivistMeetings: Not on file  . Marital Status: Not on file    Family History: Family History  Problem Relation Age of Onset  . Diabetes Father   . Heart disease Father     Allergies: No Known Allergies  Medications Prior to Admission  Medication Sig Dispense Refill Last Dose  . AMBULATORY NON FORMULARY MEDICATION 1 Device by Other route once a week. Blood pressure cuff/Medium  Monitored Regularly at home ICD 10: Z34.90 LROB 1 kit 0 01/24/2020 at Unknown time  . Prenatal Vit-Fe Fumarate-FA (PRENATAL VITAMINS PO) Take by mouth.   01/24/2020 at Unknown  time     Review of Systems:  All systems reviewed and negative except as stated in HPI  PE: Blood pressure (!) 147/93, pulse 82, temperature 98.1 F (36.7 C), temperature source Oral, resp. rate 16, height _0  (1.676 m), weight 86.6 kg, last menstrual period 05/13/2019, SpO2 100 %. General appearance: alert and cooperative Lungs: regular rate and effort Heart: regular rate  Abdomen: soft, non-tender Extremities: Homans sign is negative, no sign of DVT Presentation: breech, confirmed by bedside US   Prenatal labs: ABO, Rh: --/--/B POS, B POS Performed at [facility], [place] [address], [place], [place] [address]  [phone]) Antibody: NEG (03/06 0810) Rubella: 24.40 (09/08 1114) RPR: NON REACTIVE (03/06 0810)  HBsAg: Negative (09/08 1114)  HIV: Non Reactive (01/12 0843)  GBS: Negative/--  (03/03 1316)  2 hr GTT 89/161/97  Prenatal Transfer Tool  Maternal Diabetes: No Genetic Screening: Normal Maternal Ultrasounds/Referrals: Normal Fetal Ultrasounds or other Referrals:  None Maternal Substance Abuse:  No Significant Maternal Medications:  None Significant Maternal Lab Results: None  No results found for this or any previous visit (from the past 24 hour(s)).  Patient Active Problem List   Diagnosis Date Noted  . Breech presentation 01/25/2020  . Preeclampsia 01/24/2020  . Thrombocytopenia affecting pregnancy ([place]) 01/24/2020  . Decreased fetal movement 01/08/2020  . Supervision of normal first pregnancy, antepartum 07/31/2019  . Vitamin D deficiency 02/08/2019  . Pituitary adenoma ([place]) 04/08/2018  . Hyperprolactinemia ([place]) 04/08/2018    Assessment: Sandra Smith is a 29 y.o. G1P0 at 59w0dhere for primary Cesarean delivery in the context of breech presentation and pre-eclampsia.   Plan: The risks of cesarean section were discussed with the patient including but were not limited to: bleeding which may require transfusion or reoperation; infection which may require antibiotics; injury to bowel, bladder, ureters or other surrounding organs; injury to the fetus; need for additional procedures including hysterectomy in the event of a life-threatening hemorrhage; placental abnormalities wth subsequent pregnancies, incisional problems, thromboembolic phenomenon and other postoperative/anesthesia complications.    The patient concurred with the proposed plan, giving informed written consent for the procedures.  Patient has been NPO since last night she will remain NPO for procedure. Anesthesia and OR aware.  Preoperative prophylactic antibiotics and SCDs ordered on call to the OR.  To OR when ready.   Sandra Smith L Keslie Gritz, DO  01/27/2020, 8:28 AM

## 2020-01-27 NOTE — Interval H&P Note (Signed)
History and Physical Interval Note:  01/27/2020 8:35 AM  Sandra Smith  has presented today for surgery, with the diagnosis of breech.  The various methods of treatment have been discussed with the patient and family. After consideration of risks, benefits and other options for treatment, the patient has consented to  Procedure(s): CESAREAN SECTION (N/A) as a surgical intervention.  The patient's history has been reviewed, patient examined, no change in status, stable for surgery.  I have reviewed the patient's chart and labs.  Questions were answered to the patient's satisfaction.     [name]

## 2020-01-27 NOTE — Op Note (Signed)
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   PROCEDURE: primary low transverse cesarean section via pfannenstiel skin incision with double layer uterine closure  SURGEON: Surgeon(s) and Role:    Kennon Rounds, Standley Dakins, MD - Primary    * Octave Montrose L, DO - Assisting  ASSISTANT: None  ANESTHESIA: spinal  ESTIMATED BLOOD LOSS: 423 mL  DRAINS: 100 mL UOP via indwelling foley  TOTAL IV FLUIDS: 1500 mL crystalloid  VTE PROPHYLAXIS: SCDs to bilateral lower extremities  ANTIBIOTICS: Two grams of Cefazolin were given., within 1 hour of skin incision  SPECIMENS: None  COMPLICATIONS: None  INDICATIONS: Breech presentation, declined ECV, preeclampsia w/o severe features  FINDINGS: No intra-abdominal adhesions were noted. 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 Clear amniotic fluid, complete breech female infant, weight per medical record, APGARs 8/8, intact placenta.  PROCEDURE IN DETAIL: The patient was taken to the operating room where anesthesia was administered and normal fetal heart tones were confirmed. She was then prepped and draped in the normal fashion in the dorsal supine position with a leftward tilt.  After a time out was performed, a pfannensteil skin incision was made with the scalpel and carried through to the underlying layer of fascia. The fascia was then incised at the midline and this incision was extended laterally bluntly. Attention was turned to the superior aspect of the fascial incision which was grasped manually, tented up and the rectus muscles were dissected off bluntly. In a similar fashion the inferior aspect of the fascial incision was grasped manually, tented up and the rectus muscles dissected off bluntly. The rectus muscles were then separated in the  midline and the peritoneum was entered bluntly. The Alexis retractor was inserted and the vesicouterine peritoneum was identified.  A low transverse hysterotomy was made with the scalpel until the endometrial cavity was breached and the amniotic sac ruptured with the Allis clamp, yielding clear amniotic fluid. This incision was extended bluntly and the infant's hips were grasped- subsequently the bottom, legs, arms, and head were delivered atraumatically. The cord was clamped x 2 and cut, and the infant was handed to the awaiting pediatricians, after delayed cord clamping was done.  The placenta was then gradually expressed from the uterus and then the uterus was cleared of all clots and debris. The hysterotomy was repaired with a running suture of 0 Monocryl. A second imbricating layer of 0 Monocryl suture was then placed, achieving excellent hemostasis.   The hysterotomy and all operative sites were reinspected and excellent hemostasis was noted.  The peritoneum was closed in a purse-string fashion with a 0 Monocryl. The fascia was reapproximated with 0 Vicryl in a simple running fashion bilaterally. The skin was then closed with 4-0 Vicryl, in a subcuticular fashion.  The patient  tolerated the procedure well. Sponge, lap, needle, and instrument counts were correct x 2. The patient was transferred to the recovery room awake, alert and breathing independently in stable condition.  Merilyn Baba, DO [facility] [employer])

## 2020-01-27 NOTE — Anesthesia Preprocedure Evaluation (Addendum)
Anesthesia Evaluation  Patient identified by MRN, date of birth, ID band Patient awake    Reviewed: Allergy & Precautions, NPO status , Patient's Chart, lab work & pertinent test results  History of Anesthesia Complications Negative for: history of anesthetic complications  Airway Mallampati: II  TM Distance: >3 FB Neck ROM: Full    Dental no notable dental hx. (+) Dental Advisory Given   Pulmonary neg pulmonary ROS,    Pulmonary exam normal        Cardiovascular hypertension, Normal cardiovascular exam     Neuro/Psych negative neurological ROS     GI/Hepatic negative GI ROS, Neg liver ROS,   Endo/Other  negative endocrine ROS  Renal/GU negative Renal ROS     Musculoskeletal negative musculoskeletal ROS (+)   Abdominal   Peds  Hematology negative hematology ROS (+)   Anesthesia Other Findings Day of surgery medications reviewed with the patient.  Reproductive/Obstetrics (+) Pregnancy Preeclampsia                            Anesthesia Physical  Anesthesia Plan  ASA: III  Anesthesia Plan: Spinal   Post-op Pain Management:    Induction:   PONV Risk Score and Plan: 3 and Ondansetron, Dexamethasone, Scopolamine patch - Pre-op and Treatment may vary due to age or medical condition  Airway Management Planned: Natural Airway  Additional Equipment:   Intra-op Plan:   Post-operative Plan:   Informed Consent: I have reviewed the patients History and Physical, chart, labs and discussed the procedure including the risks, benefits and alternatives for the proposed anesthesia with the patient or authorized representative who has indicated his/her understanding and acceptance.     Dental advisory given  Plan Discussed with: Anesthesiologist and CRNA  Anesthesia Plan Comments:        Anesthesia Quick Evaluation

## 2020-01-27 NOTE — Discharge Summary (Signed)
Postpartum Discharge Summary     Patient Name: Sandra Smith DOB: 11/19/1991 MRN: [id number]  Date of admission: 01/27/2020 Delivering Provider: PRATT, TANYA S   Date of discharge: 01/29/2020  Admitting diagnosis: Delivery by cesarean section for breech presentation [O32.1XX0] Intrauterine pregnancy: [redacted]w[redacted]d     Secondary diagnosis:  Principal Problem:   Breech presentation Active Problems:   Supervision of normal first pregnancy, antepartum   Pituitary adenoma (HCC)   Hyperprolactinemia (HCC)   Preeclampsia   Thrombocytopenia affecting pregnancy (HCC)   Delivery by cesarean section for breech presentation   Anemia  Additional problems:      Discharge diagnosis: Term Pregnancy Delivered                                                                                                Post partum procedures:None  Augmentation: None  Complications: None  Hospital course:  Sceduled C/S   29 y.o. yo G1P1001 at [redacted]w[redacted]d was admitted to the hospital 01/27/2020 for scheduled cesarean section with the following indication:Malpresentation and preeclampsia.  Membrane Rupture Time/Date: 9:36 AM ,01/27/2020   Patient delivered a Viable infant.01/27/2020  Details of operation can be found in separate operative note.    Pateint had an uncomplicated postpartum course.    BP's PP remained elevated and she was started on medications, she was discharged on Amlodipine 10mg daily with BP's in the 140's/90's. BP check was scheduled for 02/04/2020 prior to discharge.   She declined contraception prior to discharge.   On day of discharge she is ambulating, tolerating a regular diet, passing flatus, and urinating well. Patient is discharged home in stable condition on  01/29/20        Delivery time: 9:37 AM    Magnesium Sulfate received: No BMZ received: No Rhophylac:N/A MMR:N/A Transfusion:No  Physical exam  Vitals:   01/28/20 1356 01/28/20 2057 01/29/20 0214 01/29/20 0512  BP: (!) 148/96 (!) 136/91 (!)  144/93 (!) 141/94  Pulse: 79 89  74  Resp: 16 16 18 18  Temp: 98 F (36.7 C) 98.4 F (36.9 C)  (!) 97 F (36.1 C)  TempSrc: Oral Oral  Oral  SpO2:  100%    Weight:      Height:       General: alert, cooperative and no distress Lochia: appropriate Uterine Fundus: firm Incision: Healing well with no significant drainage, Dressing is clean, dry, and intact DVT Evaluation: No evidence of DVT seen on physical exam. Calf/Ankle edema is present, mild bilaterally Labs: Lab Results  Component Value Date   WBC 12.4 (H) 01/28/2020   HGB 9.1 (L) 01/28/2020   HCT 28.3 (L) 01/28/2020   MCV 86.0 01/28/2020   PLT 132 (L) 01/28/2020   CMP Latest Ref Rng & Units 01/27/2020  Glucose 70 - 99 mg/dL -  BUN 6 - 20 mg/dL -  Creatinine 0.44 - 1.00 mg/dL 0.62  Sodium 135 - 145 mmol/L -  Potassium 3.5 - 5.1 mmol/L -  Chloride 98 - 111 mmol/L -  CO2 22 - 32 mmol/L -  Calcium 8.9 - 10.3 mg/dL -  Total Protein 6.5 - 8.1   g/dL -  Total Bilirubin 0.3 - 1.2 mg/dL -  Alkaline Phos 38 - 126 U/L -  AST 15 - 41 U/L -  ALT 0 - 44 U/L -   Edinburgh Score: Edinburgh Postnatal Depression Scale Screening Tool 01/27/2020  I have been able to laugh and see the funny side of things. 0  I have looked forward with enjoyment to things. 0  I have blamed myself unnecessarily when things went wrong. 1  I have been anxious or worried for no good reason. 3  I have felt scared or panicky for no good reason. 2  Things have been getting on top of me. 2  I have been so unhappy that I have had difficulty sleeping. 2  I have felt sad or miserable. 1  I have been so unhappy that I have been crying. 1  The thought of harming myself has occurred to me. 0  Edinburgh Postnatal Depression Scale Total 12    Discharge instruction: per After Visit Summary and "Baby and Me Booklet".  After visit meds:  Allergies as of 01/29/2020   No Known Allergies     Medication List    TAKE these medications   acetaminophen 500 MG  tablet Commonly known as: TYLENOL Take 2 tablets (1,000 mg total) by mouth every 6 (six) hours.   AMBULATORY NON FORMULARY MEDICATION 1 Device by Other route once a week. Blood pressure cuff/Medium  Monitored Regularly at home ICD 10: Z34.90 LROB   amLODipine 10 MG tablet Commonly known as: NORVASC Take 1 tablet (10 mg total) by mouth daily.   ferrous sulfate 325 (65 FE) MG tablet Take 1 tablet (325 mg total) by mouth every other day.   ibuprofen 800 MG tablet Commonly known as: ADVIL Take 1 tablet (800 mg total) by mouth every 6 (six) hours.   oxyCODONE 5 MG immediate release tablet Commonly known as: Oxy IR/ROXICODONE Take 1 tablet (5 mg total) by mouth every 4 (four) hours as needed for moderate pain.   polyethylene glycol powder 17 GM/SCOOP powder Commonly known as: GLYCOLAX/MIRALAX Take 255 g by mouth once for 1 dose.   prenatal multivitamin Tabs tablet Take 1 tablet by mouth daily at 12 noon.       Diet: routine diet  Activity: Advance as tolerated. Pelvic rest for 6 weeks.   Outpatient follow up:4 weeks Follow up Appt: Future Appointments  Date Time Provider Department Center  02/04/2020 10:30 AM Harraway-Smith, Carolyn, MD [facility] None  02/28/2020 10:45 AM Harraway-Smith, Carolyn, MD [facility] None   Follow up Visit: Please schedule this patient for Postpartum visit in: 4 weeks with the following provider: Any provider Virtual For C/S patients schedule nurse incision check in weeks 2 weeks: yes High risk pregnancy complicated by: Pre-eclampsia, Thrombocytopenia, Pituitary adenoma, Hyperprolactinemia, Vitamin D deficiency Delivery mode:  CS Anticipated Birth Control:  other/unsure PP Procedures needed: Incision check  Schedule Integrated BH visit: no   Newborn Data: Live born female  Birth Weight:  3209 grams APGAR: 8, 8  Newborn Delivery   Birth date/time: 01/27/2020 09:37:00 Delivery type: C-Section, Low Transverse Trial of labor: No C-section  categorization: Primary      Baby Feeding: Bottle and Breast Disposition:home with mother   01/29/2020 Matthew M Eckstat, MD   

## 2020-01-27 NOTE — Anesthesia Procedure Notes (Signed)
Spinal  Patient location during procedure: OR Start time: 01/27/2020 9:12 AM End time: 01/27/2020 9:22 AM Staffing Performed: anesthesiologist  Anesthesiologist: Duane Boston, MD Preanesthetic Checklist Completed: patient identified, IV checked, risks and benefits discussed, surgical consent, monitors and equipment checked, pre-op evaluation and timeout performed Spinal Block Patient position: sitting Prep: DuraPrep Patient monitoring: cardiac monitor, continuous pulse ox and blood pressure Approach: midline Location: L2-3 Injection technique: single-shot Needle Needle type: Pencan  Needle gauge: 24 G Needle length: 9 cm Additional Notes Functioning IV was confirmed and monitors were applied. Sterile prep and drape, including hand hygiene and sterile gloves were used. The patient was positioned and the spine was prepped. The skin was anesthetized with lidocaine.  Free flow of clear CSF was obtained prior to injecting local anesthetic into the CSF.  The spinal needle aspirated freely following injection.  The needle was carefully withdrawn.  The patient tolerated the procedure well.

## 2020-01-28 DIAGNOSIS — D649 Anemia, unspecified: Secondary | ICD-10-CM

## 2020-01-28 LAB — CBC
HCT: 28.3 % — ABNORMAL LOW (ref 36.0–46.0)
Hemoglobin: 9.1 g/dL — ABNORMAL LOW (ref 12.0–15.0)
MCH: 27.7 pg (ref 26.0–34.0)
MCHC: 32.2 g/dL (ref 30.0–36.0)
MCV: 86 fL (ref 80.0–100.0)
Platelets: 132 10*3/uL — ABNORMAL LOW (ref 150–400)
RBC: 3.29 MIL/uL — ABNORMAL LOW (ref 3.87–5.11)
RDW: 14.2 % (ref 11.5–15.5)
WBC: 12.4 10*3/uL — ABNORMAL HIGH (ref 4.0–10.5)
nRBC: 0 % (ref 0.0–0.2)

## 2020-01-28 MED ORDER — AMLODIPINE BESYLATE 5 MG PO TABS
5.0000 mg | ORAL_TABLET | Freq: Every day | ORAL | Status: DC
  Administered 2020-01-28: 5 mg via ORAL
  Filled 2020-01-28: qty 1

## 2020-01-28 NOTE — Progress Notes (Addendum)
Subjective: Postpartum Day 2: Cesarean Delivery Patient reports tolerating PO and no problems voiding, pain well controlled. Denies pre-e sx. Has not passed flatus.  Objective: Vital signs in last 24 hours: Temp:  [97.7 F (36.5 C)-99.5 F (37.5 C)] 97.8 F (36.6 C) (03/08 0500) Pulse Rate:  [67-86] 75 (03/08 0500) Resp:  [17-24] 17 (03/08 0500) BP: (126-143)/(76-109) 136/91 (03/08 0950) SpO2:  [98 %-100 %] 100 % (03/08 0500)  Physical Exam:  General: alert, cooperative and no distress Heart: RRR Lungs: CTAB Lochia: appropriate Uterine Fundus: firm Incision: healing well, no significant drainage, no dehiscence, no significant erythema, dressing c/d/i. DVT Evaluation: No evidence of DVT seen on physical exam. Negative Homan's sign. No cords or calf tenderness. No significant calf/ankle edema.  Recent Labs    01/26/20 0810 01/28/20 0359  HGB 10.9* 9.1*  HCT 34.0* 28.3*   Assessment/Plan: Status post Cesarean section. Doing well postoperatively.  Preeclampsia, delivered- start Norvasc today Anemia Thrombocytopenia- stable Encouraged ambulation in halls Discharge home tomorrow  Julianne Handler, [place] 01/28/2020, 10:24 AM

## 2020-01-28 NOTE — Lactation Note (Addendum)
This note was copied from a baby's chart. Lactation Consultation Note  Patient Name: Sandra Smith [id number] Date: 01/28/2020 Reason for consult: Initial assessment;1st time breastfeeding;NICU baby;Early term 37-38.6wks P1, 21 hour female infant that was transition to NICU due to grunting and early life hx of > 92%. . Mom with hx: pre-eclampsia, C/S delivery and infant was breech. Mom declined Urdu interpreter [place] [phone]). LC discussed hand expression and mom taught back expressing 3 mls of colostrum in bullet. Mom will use DEBP every 3 hours for 15 minutes on initial setting to help establish milk supply due to infant being in NICU (infant separation).   Mom was given a hand pump and breast shells due to having flat nipples. Mom understands to wear breast shells in bra during the day and not to sleep in them. Mom using DEBP as LC left the room. Mom will follow NICU infant feeding guidelines.  Mom shown how to use DEBP & how to disassemble, clean, & reassemble parts. Reviewed Baby & Me book's Breastfeeding Basics.  Mom made aware of O/P services, breastfeeding support groups, community resources, and our phone # for post-discharge questions.  Maternal Data Formula Feeding for Exclusion: No Has patient been taught Hand Expression?: Yes Does the patient have breastfeeding experience prior to this delivery?: No  Feeding Feeding Type: Formula Nipple Type: Nfant Extra Slow Flow (gold)  LATCH Score                   Interventions Interventions: Breast feeding basics reviewed;Skin to skin;DEBP;Hand pump;Hand express;Expressed milk  Lactation Tools Discussed/Used WIC Program: Yes Pump Review: Setup, frequency, and cleaning;Milk Storage Initiated by:: [name], IBCLC Date initiated:: 01/28/20   Consult Status Consult Status: Follow-up Date: 01/28/20 Follow-up type: In-patient    [name] 01/28/2020, 6:39 AM

## 2020-01-28 NOTE — Lactation Note (Signed)
This note was copied from a baby's chart. Lactation Consultation Note  Patient Name: Sandra Smith [id number] Date: 01/28/2020 Reason for consult: Follow-up assessment;Other (Comment)(mom sound asleep and grandmother said she was sleeping)   Maternal Data    Feeding    LATCH Score                   Interventions    Lactation Tools Discussed/Used     Consult Status Consult Status: Follow-up Date: 01/28/20 Follow-up type: In-patient    Dante 01/28/2020, 3:12 PM

## 2020-01-28 NOTE — Progress Notes (Signed)
CSW received consult due to score 12 on Edinburgh Depression Screen.    CSW congratulated MOB on the birth of infant. CSW advised MOB of HIPPA policy as CSW observed that MOB had another lady in the room with her. MOB reported that this was her aunt and that it was fine for her to remain in the room while CSW spoke with her. CSW understanding of this and advised MOB of CSW's role and the reason for CSW coming to speak with her. MOB reported that everything over the last 7 days have ben "normal/goood". CSW asked MOB if she had been feeling overwhelmed or anything and MOB reported that she hasn't. CSW understanding of this and assessed MOB for SI and HI and DV-in which MOB denied all of these. MOB reported that she has support from her sister and FOB. MOB reported that she has all needed items to care for infant with no other concerns at this time.   CSW provided education regarding Baby Blues vs PMADs and provided MOB with resources for mental health follow up.  CSW encouraged MOB to evaluate her mental health throughout the postpartum period with the use of the New Mom Checklist developed by [facility]partum [facility] as well as the [place] Postnatal Depression Scale and notify a medical professional if symptoms arise.      Sandra Smith, MSW, LCSW [facility] [phone]

## 2020-01-29 ENCOUNTER — Encounter: Payer: Medicaid Other | Admitting: Advanced Practice Midwife

## 2020-01-29 MED ORDER — AMLODIPINE BESYLATE 10 MG PO TABS: 10.0000 mg | ORAL_TABLET | Freq: Every day | ORAL | 2 refills | Status: DC

## 2020-01-29 MED ORDER — FERROUS SULFATE 325 (65 FE) MG PO TABS
325.0000 mg | ORAL_TABLET | ORAL | Status: DC
  Administered 2020-01-29: 325 mg via ORAL
  Filled 2020-01-29: qty 1

## 2020-01-29 MED ORDER — IBUPROFEN 800 MG PO TABS: 800.0000 mg | ORAL_TABLET | Freq: Four times a day (QID) | ORAL | 0 refills | Status: DC

## 2020-01-29 MED ORDER — AMLODIPINE BESYLATE 5 MG PO TABS
10.0000 mg | ORAL_TABLET | Freq: Every day | ORAL | Status: DC
  Administered 2020-01-29 – 2020-01-30 (×2): 10 mg via ORAL
  Filled 2020-01-29 (×2): qty 2

## 2020-01-29 MED ORDER — POLYETHYLENE GLYCOL 3350 17 GM/SCOOP PO POWD: 1.0000 | Freq: Once | ORAL | 0 refills | Status: AC

## 2020-01-29 MED ORDER — OXYCODONE HCL 5 MG PO TABS: 5.0000 mg | ORAL_TABLET | ORAL | 0 refills | Status: DC | PRN

## 2020-01-29 MED ORDER — FERROUS SULFATE 325 (65 FE) MG PO TABS: 325.0000 mg | ORAL_TABLET | ORAL | 1 refills | Status: DC

## 2020-01-29 MED ORDER — ACETAMINOPHEN 500 MG PO TABS: 1000.0000 mg | ORAL_TABLET | Freq: Four times a day (QID) | ORAL | 0 refills | Status: DC

## 2020-01-29 MED FILL — FERROUS SULFATE 325 MG TAB: 325 (65 FE) | 60 days supply | Qty: 30 | Fill #0

## 2020-01-29 MED FILL — IBUPROFEN 800 MG TAB: 800 | 8 days supply | Qty: 30 | Fill #0

## 2020-01-29 MED FILL — AMLODIPINE BESYLATE 10 MG T: 10 | 30 days supply | Qty: 30 | Fill #0

## 2020-01-29 MED FILL — ACETAMINOPHEN 500MG XT STRE: 500 | 4 days supply | Qty: 30 | Fill #0

## 2020-01-29 MED FILL — oxyCODONE HCL 5 MG TABS: 5 | 4 days supply | Qty: 20 | Fill #0

## 2020-01-29 MED FILL — POLYETHYLENE GLYCOL 3350 PO: 17 | 14 days supply | Qty: 238 | Fill #0

## 2020-01-29 NOTE — Lactation Note (Signed)
This note was copied from a baby's chart. Lactation Consultation Note Baby 70 hrs old. Baby had 6% wt. loss less than 24 hrs. Mom has flat nipple cone shaped breast. Breast feel heavy. Hand expression taught and encouraged mom to hand express to soften areola for latching. Encouraged mom to do so before latching. Attempted to latch baby, he got on the breast and just cried. Hand expressed 2 ml colostrum.  Fitted mom w/#20 NS. Inserted 1 ml into NS. Baby bf well then sound asleep. Baby was warm so needed un-swaddled before latching.   Noted pacifier in bed. Discouraged for 2 weeks. Encouraged mom to wear shells in am. Encouraged mom to use DEBP.  Newborn behavior discussed. Reported RN  Patient Name: Sandra Smith [id number] Date: 01/29/2020 Reason for consult: Follow-up assessment;Difficult latch;Primapara;Term;Early term 37-38.6wks;Infant weight loss   Maternal Data    Feeding Feeding Type: Breast Milk  LATCH Score Latch: Repeated attempts needed to sustain latch, nipple held in mouth throughout feeding, stimulation needed to elicit sucking reflex.  Audible Swallowing: A few with stimulation  Type of Nipple: Flat  Comfort (Breast/Nipple): Filling, red/small blisters or bruises, mild/mod discomfort  Hold (Positioning): Full assist, staff holds infant at breast  LATCH Score: 4  Interventions Interventions: Breast feeding basics reviewed;Support pillows;Assisted with latch;Position options;Skin to skin;Expressed milk;Breast massage;Hand express;Shells;Pre-pump if needed;Reverse pressure;Hand pump;Breast compression;Adjust position  Lactation Tools Discussed/Used Tools: Shells;Pump;Nipple Shields Nipple shield size: 20 Shell Type: Inverted Breast pump type: Double-Electric Breast Pump;Manual   Consult Status Consult Status: Follow-up Date: 01/29/20 Follow-up type: In-patient    Apollo Timothy, [place] 01/29/2020, 2:05 AM

## 2020-01-29 NOTE — Discharge Instructions (Signed)
Postpartum Care After Cesarean Delivery This sheet gives you information about how to care for yourself from the time you deliver your baby to up to 6-12 weeks after delivery (postpartum period). Your health care provider may also give you more specific instructions. If you have problems or questions, contact your health care provider. Follow these instructions at home: Medicines  Take over-the-counter and prescription medicines only as told by your health care provider.  If you were prescribed an antibiotic medicine, take it as told by your health care provider. Do not stop taking the antibiotic even if you start to feel better.  Ask your health care provider if the medicine prescribed to you: ? Requires you to avoid driving or using heavy machinery. ? Can cause constipation. You may need to take actions to prevent or treat constipation, such as:  Drink enough fluid to keep your urine pale yellow.  Take over-the-counter or prescription medicines.  Eat foods that are high in fiber, such as beans, whole grains, and fresh fruits and vegetables.  Limit foods that are high in fat and processed sugars, such as fried or sweet foods. Activity  Gradually return to your normal activities as told by your health care provider.  Avoid activities that take a lot of effort and energy (are strenuous) until approved by your health care provider. Walking at a slow to moderate pace is usually safe. Ask your health care provider what activities are safe for you. ? Do not lift anything that is heavier than your baby or 10 lb (4.5 kg) as told by your health care provider. ? Do not vacuum, climb stairs, or drive a car for as long as told by your health care provider.  If possible, have someone help you at home until you are able to do your usual activities yourself.  Rest as much as possible. Try to rest or take naps while your baby is sleeping. Vaginal bleeding  It is normal to have vaginal bleeding  (lochia) after delivery. Wear a sanitary pad to absorb vaginal bleeding and discharge. ? During the first week after delivery, the amount and appearance of lochia is often similar to a menstrual period. ? Over the next few weeks, it will gradually decrease to a dry, yellow-brown discharge. ? For most women, lochia stops completely by 4-6 weeks after delivery. Vaginal bleeding can vary from woman to woman.  Change your sanitary pads frequently. Watch for any changes in your flow, such as: ? A sudden increase in volume. ? A change in color. ? Large blood clots.  If you pass a blood clot, save it and call your health care provider to discuss. Do not flush blood clots down the toilet before you get instructions from your health care provider.  Do not use tampons or douches until your health care provider says this is safe.  If you are not breastfeeding, your period should return 6-8 weeks after delivery. If you are breastfeeding, your period may return anytime between 8 weeks after delivery and the time that you stop breastfeeding. Perineal care   If your C-section (Cesarean section) was unplanned, and you were allowed to labor and push before delivery, you may have pain, swelling, and discomfort of the tissue between your vaginal opening and your anus (perineum). You may also have an incision in the tissue (episiotomy) or the tissue may have torn during delivery. Follow these instructions as told by your health care provider: ? Keep your perineum clean and dry as told by   your health care provider. Use medicated pads and pain-relieving sprays and creams as directed. ? If you have an episiotomy or vaginal tear, check the area every day for signs of infection. Check for:  Redness, swelling, or pain.  Fluid or blood.  Warmth.  Pus or a bad smell. ? You may be given a squirt bottle to use instead of wiping to clean the perineum area after you go to the bathroom. As you start healing, you may use  the squirt bottle before wiping yourself. Make sure to wipe gently. ? To relieve pain caused by an episiotomy, vaginal tear, or hemorrhoids, try taking a warm sitz bath 2-3 times a day. A sitz bath is a warm water bath that is taken while you are sitting down. The water should only come up to your hips and should cover your buttocks. Breast care  Within the first few days after delivery, your breasts may feel heavy, full, and uncomfortable (breast engorgement). You may also have milk leaking from your breasts. Your health care provider can suggest ways to help relieve breast discomfort. Breast engorgement should go away within a few days.  If you are breastfeeding: ? Wear a bra that supports your breasts and fits you well. ? Keep your nipples clean and dry. Apply creams and ointments as told by your health care provider. ? You may need to use breast pads to absorb milk leakage. ? You may have uterine contractions every time you breastfeed for several weeks after delivery. Uterine contractions help your uterus return to its normal size. ? If you have any problems with breastfeeding, work with your health care provider or a lactation consultant.  If you are not breastfeeding: ? Avoid touching your breasts as this can make your breasts produce more milk. ? Wear a well-fitting bra and use cold packs to help with swelling. ? Do not squeeze out (express) milk. This causes you to make more milk. Intimacy and sexuality  Ask your health care provider when you can engage in sexual activity. This may depend on your: ? Risk of infection. ? Healing rate. ? Comfort and desire to engage in sexual activity.  You are able to get pregnant after delivery, even if you have not had your period. If desired, talk with your health care provider about methods of family planning or birth control (contraception). Lifestyle  Do not use any products that contain nicotine or tobacco, such as cigarettes, e-cigarettes,  and chewing tobacco. If you need help quitting, ask your health care provider.  Do not drink alcohol, especially if you are breastfeeding. Eating and drinking   Drink enough fluid to keep your urine pale yellow.  Eat high-fiber foods every day. These may help prevent or relieve constipation. High-fiber foods include: ? Whole grain cereals and breads. ? Brown rice. ? Beans. ? Fresh fruits and vegetables.  Take your prenatal vitamins until your postpartum checkup or until your health care provider tells you it is okay to stop. General instructions  Keep all follow-up visits for you and your baby as told by your health care provider. Most women visit their health care provider for a postpartum checkup within the first 3-6 weeks after delivery. Contact a health care provider if you:  Feel unable to cope with the changes that a new baby brings to your life, and these feelings do not go away.  Feel unusually sad or worried.  Have breasts that are painful, hard, or turn red.  Have a fever.    Have trouble holding urine or keeping urine from leaking.  Have little or no interest in activities you used to enjoy.  Have not breastfed at all and you have not had a menstrual period for 12 weeks after delivery.  Have stopped breastfeeding and you have not had a menstrual period for 12 weeks after you stopped breastfeeding.  Have questions about caring for yourself or your baby.  Pass a blood clot from your vagina. Get help right away if you:  Have chest pain.  Have difficulty breathing.  Have sudden, severe leg pain.  Have severe pain or cramping in your abdomen.  Bleed from your vagina so much that you fill more than one sanitary pad in one hour. Bleeding should not be heavier than your heaviest period.  Develop a severe headache.  Faint.  Have blurred vision or spots in your vision.  Have a bad-smelling vaginal discharge.  Have thoughts about hurting yourself or your  baby. If you ever feel like you may hurt yourself or others, or have thoughts about taking your own life, get help right away. You can go to your nearest emergency department or call:  Your local emergency services (911 in the [place]  A suicide crisis helpline, such as the [facility] at (814) 350-6969. This is open 24 hours a day. Summary  The period of time from when you deliver your baby to up to 6-12 weeks after delivery is called the postpartum period.  Gradually return to your normal activities as told by your health care provider.  Keep all follow-up visits for you and your baby as told by your health care provider. This information is not intended to replace advice given to you by your health care provider. Make sure you discuss any questions you have with your health care provider. Document Revised: 06/28/2018 Document Reviewed: 06/28/2018 Elsevier Patient Education  [place].    Postpartum Hypertension Postpartum hypertension is high blood pressure that remains higher than normal after childbirth. You may not realize that you have postpartum hypertension if your blood pressure is not being checked regularly. In most cases, postpartum hypertension will go away on its own, usually within a week of delivery. However, for some women, medical treatment is required to prevent serious complications, such as seizures or stroke. What are the causes? This condition may be caused by one or more of the following:  Hypertension that existed before pregnancy (chronic hypertension).  Hypertension that comes on as a result of pregnancy (gestational hypertension).  Hypertensive disorders during pregnancy (preeclampsia) or seizures in women who have high blood pressure during pregnancy (eclampsia).  A condition in which the liver, platelets, and red blood cells are damaged during pregnancy (HELLP syndrome).  A condition in which the thyroid produces too much  hormones (hyperthyroidism).  Other rare problems of the nerves (neurological disorders) or blood disorders. In some cases, the cause may not be known. What increases the risk? The following factors may make you more likely to develop this condition:  Chronic hypertension. In some cases, this may not have been diagnosed before pregnancy.  Obesity.  Type 2 diabetes.  Kidney disease.  History of preeclampsia or eclampsia.  Other medical conditions that change the level of hormones in the body (hormonal imbalance). What are the signs or symptoms? As with all types of hypertension, postpartum hypertension may not have any symptoms. Depending on how high your blood pressure is, you may experience:  Headaches. These may be mild, moderate, or severe.  They may also be steady, constant, or sudden in onset (thunderclap headache).  Changes in your ability to see (visual changes).  Dizziness.  Shortness of breath.  Swelling of your hands, feet, lower legs, or face. In some cases, you may have swelling in more than one of these locations.  Heart palpitations or a racing heartbeat.  Difficulty breathing while lying down.  Decrease in the amount of urine that you pass. Other rare signs and symptoms may include:  Sweating more than usual. This lasts longer than a few days after delivery.  Chest pain.  Sudden dizziness when you get up from sitting or lying down.  Seizures.  Nausea or vomiting.  Abdominal pain. How is this diagnosed? This condition may be diagnosed based on the results of a physical exam, blood pressure measurements, and blood and urine tests. You may also have other tests, such as a CT scan or an MRI, to check for other problems of postpartum hypertension. How is this treated? If blood pressure is high enough to require treatment, your options may include:  Medicines to reduce blood pressure (antihypertensives). Tell your health care provider if you are  breastfeeding or if you plan to breastfeed. There are many antihypertensive medicines that are safe to take while breastfeeding.  Stopping medicines that may be causing hypertension.  Treating medical conditions that are causing hypertension.  Treating the complications of hypertension, such as seizures, stroke, or kidney problems. Your health care provider will also continue to monitor your blood pressure closely until it is within a safe range for you. Follow these instructions at home:  Take over-the-counter and prescription medicines only as told by your health care provider.  Return to your normal activities as told by your health care provider. Ask your health care provider what activities are safe for you.  Do not use any products that contain nicotine or tobacco, such as cigarettes and e-cigarettes. If you need help quitting, ask your health care provider.  Keep all follow-up visits as told by your health care provider. This is important. Contact a health care provider if:  Your symptoms get worse.  You have new symptoms, such as: ? A headache that does not get better. ? Dizziness. ? Visual changes. Get help right away if:  You suddenly develop swelling in your hands, ankles, or face.  You have sudden, rapid weight gain.  You develop difficulty breathing, chest pain, racing heartbeat, or heart palpitations.  You develop severe pain in your abdomen.  You have any symptoms of a stroke. "BE FAST" is an easy way to remember the main warning signs of a stroke: ? B - Balance. Signs are dizziness, sudden trouble walking, or loss of balance. ? E - Eyes. Signs are trouble seeing or a sudden change in vision. ? F - Face. Signs are sudden weakness or numbness of the face, or the face or eyelid drooping on one side. ? A - Arms. Signs are weakness or numbness in an arm. This happens suddenly and usually on one side of the body. ? S - Speech. Signs are sudden trouble speaking,  slurred speech, or trouble understanding what people say. ? T - Time. Time to call emergency services. Write down what time symptoms started.  You have other signs of a stroke, such as: ? A sudden, severe headache with no known cause. ? Nausea or vomiting. ? Seizure. These symptoms may represent a serious problem that is an emergency. Do not wait to see if the symptoms  will go away. Get medical help right away. Call your local emergency services (911 in the [place] Do not drive yourself to the hospital. Summary  Postpartum hypertension is high blood pressure that remains higher than normal after childbirth.  In most cases, postpartum hypertension will go away on its own, usually within a week of delivery.  For some women, medical treatment is required to prevent serious complications, such as seizures or stroke. This information is not intended to replace advice given to you by your health care provider. Make sure you discuss any questions you have with your health care provider. Document Revised: 12/15/2018 Document Reviewed: 08/29/2017 Elsevier Patient Education  2020 [employer].

## 2020-01-29 NOTE — Lactation Note (Signed)
This note was copied from a baby's chart. Lactation Consultation Note  Patient Name: Boy Nusrat Line [id number] Date: 01/29/2020 Reason for consult: Follow-up assessment  P1 mother whose infant is now 43 hours old.  This is an ETI at 37+0 weeks.    Per MD note, mother wants to bottle feed.  She does not feel like baby has been latching well.  Offered assistance and mother declined at this time.  Her aunt was in the room and stated that she will keep trying.  Encouraged her to continue putting baby to the breast prior to giving any supplementation so baby is allowed to continue practicing latching.  She can call her RN/LC as needed for latch assistance.  Baby was asleep in the bassinet when I arrived.  He is quite jaundiced.  Mother desires a discharge today and MD note confirms that baby will be weighed again at 1500 and will be getting a serum bilirubin level drawn.  If baby is in the high risk category then family will not be discharged today.  Mother has been supplementing with good volumes and encouraged her to continue to help baby bring down his bilirubin level.  Mother had no questions/concerns at this time.  RN updated.   Maternal Data    Feeding Feeding Type: Bottle Fed - Formula Nipple Type: Extra Slow Flow  LATCH Score                   Interventions    Lactation Tools Discussed/Used     Consult Status Consult Status: Follow-up Date: 01/30/20 Follow-up type: In-patient    Anajulia Leyendecker R Lamara Brecht 01/29/2020, 12:04 PM

## 2020-01-30 MED ORDER — SUCCINYLCHOLINE CHLORIDE 200 MG/10ML IV SOSY
PREFILLED_SYRINGE | INTRAVENOUS | Status: AC
  Filled 2020-01-30: qty 10

## 2020-01-30 NOTE — Discharge Summary (Signed)
Postpartum Discharge Summary     Patient Name: Sandra Smith DOB: 09/19/1991 MRN: [id number]  Date of admission: 01/27/2020 Delivering Provider: [name]   Date of discharge: 01/30/2020  Admitting diagnosis: Delivery by cesarean section for breech presentation [O32.1XX0] Intrauterine pregnancy: [redacted]w[redacted]d    Secondary diagnosis:  Principal Problem:   Breech presentation Active Problems:   Supervision of normal first pregnancy, antepartum   Pituitary adenoma ([facility]   Hyperprolactinemia ([facility]   Preeclampsia   Thrombocytopenia affecting pregnancy ([facility]   Delivery by cesarean section for breech presentation   Anemia  Additional problems:      Discharge diagnosis: Term Pregnancy Delivered                                                                                                Post partum procedures:None  Augmentation: None  Complications: None  Hospital course:  Sceduled C/S   29y.o. yo G1P1001 at [id number] admitted to the hospital 01/27/2020 for scheduled cesarean section with the following indication:Malpresentation and preeclampsia.  Membrane Rupture Time/Date: 9:36 AM ,01/27/2020   Patient delivered a Viable infant.01/27/2020  Details of operation can be found in separate operative note.    Pateint had an uncomplicated postpartum course.    BP's PP remained elevated and she was started on medications, she was discharged on Amlodipine 1077maily with BP's in the 140's/90's. BP check was scheduled for 02/04/2020 prior to discharge.   She declined contraception prior to discharge.   Discharge was delayed for one day due to infant being on bili lights.   On day of discharge she is ambulating, tolerating a regular diet, passing flatus, and urinating well. She denied headache, vision changes, chest pain, SOB, RUQ pain, or LE edema. Patient is discharged home in stable condition on  01/30/20        Delivery time: 9:37 AM    Magnesium Sulfate received: No BMZ received:  No Rhophylac:N/A MMR:N/A Transfusion:No  Physical exam  Vitals:   01/29/20 0214 01/29/20 0512 01/29/20 2053 01/30/20 0541  BP: (!) 144/93 (!) 141/94 (!) 135/93 (!) 136/98  Pulse:  74 91 96  Resp: '18 18  14  ' Temp:  (!) 97 F (36.1 C) 98.1 F (36.7 C) 97.6 F (36.4 C)  TempSrc:  Oral Oral Oral  SpO2:   99%   Weight:      Height:       General: alert, cooperative and no distress Lochia: appropriate Uterine Fundus: firm Incision: Healing well with no significant drainage, Dressing is clean, dry, and intact DVT Evaluation: No evidence of DVT seen on physical exam. Calf/Ankle edema is present, mild bilaterally Labs: Lab Results  Component Value Date   WBC 12.4 (H) 01/28/2020   HGB 9.1 (L) 01/28/2020   HCT 28.3 (L) 01/28/2020   MCV 86.0 01/28/2020   PLT 132 (L) 01/28/2020   CMP Latest Ref Rng & Units 01/27/2020  Glucose 70 - 99 mg/dL -  BUN 6 - 20 mg/dL -  Creatinine 0.44 - 1.00 mg/dL 0.62  Sodium 135 - 145 mmol/L -  Potassium 3.5 - 5.1 mmol/L -  Chloride 98 - 111 mmol/L -  CO2 22 - 32 mmol/L -  Calcium 8.9 - 10.3 mg/dL -  Total Protein 6.5 - 8.1 g/dL -  Total Bilirubin 0.3 - 1.2 mg/dL -  Alkaline Phos 38 - 126 U/L -  AST 15 - 41 U/L -  ALT 0 - 44 U/L -   Edinburgh Score: Edinburgh Postnatal Depression Scale Screening Tool 01/27/2020  I have been able to laugh and see the funny side of things. 0  I have looked forward with enjoyment to things. 0  I have blamed myself unnecessarily when things went wrong. 1  I have been anxious or worried for no good reason. 3  I have felt scared or panicky for no good reason. 2  Things have been getting on top of me. 2  I have been so unhappy that I have had difficulty sleeping. 2  I have felt sad or miserable. 1  I have been so unhappy that I have been crying. 1  The thought of harming myself has occurred to me. 0  Edinburgh Postnatal Depression Scale Total 12    Discharge instruction: per After Visit Summary and "Baby and Me  Booklet".  After visit meds:  Allergies as of 01/30/2020   No Known Allergies     Medication List    TAKE these medications   acetaminophen 500 MG tablet Commonly known as: TYLENOL Take 2 tablets (1,000 mg total) by mouth every 6 (six) hours.   AMBULATORY NON FORMULARY MEDICATION 1 Device by Other route once a week. Blood pressure cuff/Medium  Monitored Regularly at home ICD 10: Z34.90 LROB   amLODipine 10 MG tablet Commonly known as: NORVASC Take 1 tablet (10 mg total) by mouth daily.   ferrous sulfate 325 (65 FE) MG tablet Take 1 tablet (325 mg total) by mouth every other day.   ibuprofen 800 MG tablet Commonly known as: ADVIL Take 1 tablet (800 mg total) by mouth every 6 (six) hours.   oxyCODONE 5 MG immediate release tablet Commonly known as: Oxy IR/ROXICODONE Take 1 tablet (5 mg total) by mouth every 4 (four) hours as needed for moderate pain.   prenatal multivitamin Tabs tablet Take 1 tablet by mouth daily at 12 noon.     ASK your doctor about these medications   polyethylene glycol powder 17 GM/SCOOP powder Commonly known as: GLYCOLAX/MIRALAX Take 255 g by mouth once for 1 dose. Ask about: Should I take this medication?       Diet: routine diet  Activity: Advance as tolerated. Pelvic rest for 6 weeks.   Outpatient follow up:4 weeks Follow up Appt: Future Appointments  Date Time Provider [place]  02/04/2020 10:30 AM Lavonia Drafts, MD [facility] None  02/28/2020 10:45 AM Lavonia Drafts, MD [facility] None   Follow up Visit: Please schedule this patient for Postpartum visit in: 4 weeks with the following provider: Any provider Virtual For C/S patients schedule nurse incision check in weeks 2 weeks: yes High risk pregnancy complicated by: Pre-eclampsia, Thrombocytopenia, Pituitary adenoma, Hyperprolactinemia, Vitamin D deficiency Delivery mode:  CS Anticipated Birth Control:  other/unsure PP Procedures needed: Incision check   Schedule Integrated BH visit: no   Newborn Data: Live born female  Birth Weight:  3209 grams APGAR: 81, 8  Newborn Delivery   Birth date/time: 01/27/2020 09:37:00 Delivery type: C-Section, Low Transverse Trial of labor: No C-section categorization: Primary      Baby Feeding: Bottle and Breast Disposition:home with mother   01/30/2020 Annice Needy  Dione Plover, MD

## 2020-02-01 ENCOUNTER — Ambulatory Visit: Payer: Medicaid Other

## 2020-02-01 ENCOUNTER — Other Ambulatory Visit: Payer: Self-pay

## 2020-02-01 VITALS — BP 129/95 | HR 101 | Ht 67.0 in | Wt 170.0 lb

## 2020-02-01 DIAGNOSIS — Z0130 Encounter for examination of blood pressure without abnormal findings: Secondary | ICD-10-CM

## 2020-02-01 NOTE — Progress Notes (Unsigned)
Subjective:  Sandra Smith is a 29 y.o. female here for BP check.   Hypertension ROS: taking medications as instructed, no medication side effects noted, no TIA's, no chest pain on exertion, no dyspnea on exertion and no swelling of ankles.    Objective:  BP (!) 129/95   Pulse (!) 101   Ht 5\' 7"  (1.702 m)   Wt 170 lb (77.1 kg)   BMI 26.63 kg/m   Appearance alert, well appearing, and in no distress. General exam BP noted to be well controlled today in office.    Assessment:   Blood Pressure borderline controlled.   Plan:  Continue Norvasc 10 mg 1 tab PO daily  F/U for BP check in 2 weeks  Attestation of Attending Supervision of CMA/RN: Evaluation and management procedures were performed by the nurse under my supervision and collaboration.  I have reviewed the nursing note and chart, and I agree with the management and plan.  Carolyn L. Harraway-Smith, M.D., Cherlynn June

## 2020-02-04 ENCOUNTER — Encounter: Payer: Self-pay | Admitting: Obstetrics & Gynecology

## 2020-02-04 ENCOUNTER — Other Ambulatory Visit: Payer: Self-pay

## 2020-02-04 ENCOUNTER — Ambulatory Visit (INDEPENDENT_AMBULATORY_CARE_PROVIDER_SITE_OTHER): Payer: Medicaid Other | Admitting: Obstetrics & Gynecology

## 2020-02-04 VITALS — BP 123/85 | HR 103 | Ht 67.0 in | Wt 168.0 lb

## 2020-02-04 DIAGNOSIS — O1495 Unspecified pre-eclampsia, complicating the puerperium: Secondary | ICD-10-CM

## 2020-02-04 DIAGNOSIS — O9279 Other disorders of lactation: Secondary | ICD-10-CM

## 2020-02-04 DIAGNOSIS — Z9889 Other specified postprocedural states: Secondary | ICD-10-CM

## 2020-02-04 DIAGNOSIS — O1405 Mild to moderate pre-eclampsia, complicating the puerperium: Secondary | ICD-10-CM

## 2020-02-04 NOTE — Progress Notes (Signed)
History:  29 y.o. G1P1001 here today for 2 week incision check after c-section for breech presentation. Pt denies pain. She is pumping once a day and reports decreased milk supply. She was dx'd with preeclampsia and reports that she is taking the meds daily. She denies HA or visual changes.    The following portions of the patient's history were reviewed and updated as appropriate: allergies, current medications, past family history, past medical history, past social history, past surgical history and problem list.  Review of Systems:  Pertinent items are noted in HPI.    Objective:  Physical Exam Blood pressure 123/85, pulse (!) 103, height 5\' 7"  (1.702 m), weight 168 lb (76.2 kg), unknown if currently breastfeeding.  CONSTITUTIONAL: Well-developed, well-nourished female in no acute distress.  HENT:  Normocephalic, atraumatic EYES: Conjunctivae and EOM are normal. No scleral icterus.  NECK: Normal range of motion SKIN: Skin is warm and dry. No rash noted. Not diaphoretic.No pallor. [place]: Alert and oriented to person, place, and time. Normal coordination.  Abd: Soft, nontender and nondistended; incision clean, dry and intact. Steristrips applied.  Lots of glue from the dressing still in place.       Assessment & Plan:  Incision check- incision looks good. Reviewed post op care  Lactation help- reviewed supply and demand of milk prod. rec pumping more freq  Preeclampsia rec cont blood pressure meds. F/u in 2 weeks or sooner prn    Tam Delisle L. Harraway-Smith, M.D., [place]lynn June

## 2020-02-05 ENCOUNTER — Ambulatory Visit: Payer: Medicaid Other

## 2020-02-11 ENCOUNTER — Ambulatory Visit: Payer: Medicaid Other | Admitting: Obstetrics & Gynecology

## 2020-02-12 ENCOUNTER — Ambulatory Visit: Payer: Medicaid Other

## 2020-02-28 ENCOUNTER — Ambulatory Visit (INDEPENDENT_AMBULATORY_CARE_PROVIDER_SITE_OTHER): Payer: Medicaid Other | Admitting: Obstetrics & Gynecology

## 2020-02-28 ENCOUNTER — Encounter: Payer: Self-pay | Admitting: Obstetrics & Gynecology

## 2020-02-28 ENCOUNTER — Other Ambulatory Visit: Payer: Self-pay

## 2020-02-28 NOTE — Progress Notes (Signed)
Subjective:     Sandra Smith is a 29 y.o. female who presents for a postpartum visit. She is 4 weeks postpartum following a low cervical transverse Cesarean section. I have fully reviewed the prenatal and intrapartum course. The delivery was at 46 gestational weeks. Outcome: primary cesarean section, low transverse incision. Anesthesia: spinal. Postpartum course has been unremarkable. 68 course has been normal. Baby is feeding by both breast and bottle - [employer] Good Start. Bleeding staining only. Bowel function is normal. Bladder function is normal. Patient is not sexually active. Contraception method is none. Postpartum depression screening: negative.  The following portions of the patient's history were reviewed and updated as appropriate: allergies, current medications, past family history, past medical history, past social history, past surgical history and problem list.  Review of Systems Pertinent items are noted in HPI.   Objective:  BP 98/71   Pulse 82   Ht 5\' 7"  (1.702 m)   Wt 162 lb (73.5 kg)   BMI 25.37 kg/m   CONSTITUTIONAL: Well-developed, well-nourished female in no acute distress.  HENT:  Normocephalic, atraumatic EYES: Conjunctivae and EOM are normal. No scleral icterus.  NECK: Normal range of motion SKIN: Skin is warm and dry. No rash noted. Not diaphoretic.No pallor. [place]: Alert and oriented to person, place, and time. Normal coordination.  Abd: soft, NT, ND. Incision well healed.   Assessment:     4 weeks postpartum exam. Pap smear not done at today's visit.   Plan:    1. Contraception: abstinence Pt plans to travel to [place] for several months will follow up if contraception needed. Her spouse will stay here. He works in [place].  2. Follow up in: 1 year or as needed.    Baldwin Racicot L. Harraway-Smith, M.D., [place]lynn June

## 2021-01-13 ENCOUNTER — Encounter: Payer: Self-pay | Admitting: General Practice

## 2021-01-13 ENCOUNTER — Telehealth: Payer: Self-pay | Admitting: General Practice

## 2021-01-13 NOTE — Telephone Encounter (Signed)
Unable to reach patient to schedule Annual Exam via phone.  Not sure if it's working number.  Letter sent via Mychart and mail reminding pt to schedule.

## 2021-02-19 ENCOUNTER — Other Ambulatory Visit (HOSPITAL_COMMUNITY)
Admission: RE | Admit: 2021-02-19 | Discharge: 2021-02-19 | Disposition: A | Discharge status: Home / Self Care | Payer: Medicaid Other | Source: Ambulatory Visit | Attending: Family Medicine | Admitting: Family Medicine

## 2021-02-19 ENCOUNTER — Ambulatory Visit (INDEPENDENT_AMBULATORY_CARE_PROVIDER_SITE_OTHER): Payer: Medicaid Other | Admitting: Family Medicine

## 2021-02-19 ENCOUNTER — Other Ambulatory Visit: Payer: Self-pay

## 2021-02-19 VITALS — BP 99/71 | HR 75 | Wt 141.0 lb

## 2021-02-19 DIAGNOSIS — Z01419 Encounter for gynecological examination (general) (routine) without abnormal findings: Secondary | ICD-10-CM | POA: Diagnosis not present

## 2021-02-19 NOTE — Progress Notes (Signed)
GYNECOLOGY ANNUAL PREVENTATIVE CARE ENCOUNTER NOTE  Subjective:   Sandra Smith is a 30 y.o. G59P1001 female here for a routine annual gynecologic exam.  Current complaints: none. 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 No problems with menses now.   Denies abnormal vaginal bleeding, discharge, pelvic pain, problems with intercourse or other gynecologic concerns.    Gynecologic History Patient's last menstrual period was 01/29/2021. Patient is sexually active  Contraception: none Last Pap: 2020. Results were: normal Last mammogram: n/a  Obstetric History OB History  Gravida Para Term Preterm AB Living  1 1 1     1   SAB IAB Ectopic Multiple Live Births        0 1    # Outcome Date GA Lbr Len/2nd Weight Sex Delivery Anes PTL Lv  1 Term 01/27/20 [redacted]w[redacted]d  7 lb 1.2 oz (3.209 kg) M CS-LTranv Spinal  LIV    Past Medical History:  Diagnosis Date  . Hyperprolactinemia ([place])   . Internal hemorrhoids   . 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 CESAREAN SECTION N/A 01/27/2020   Procedure: CESAREAN SECTION;  Surgeon: [name], MD;  Location: [facility] LD ORS;  Service: Obstetrics;  Laterality: N/A;  . HEMORROIDECTOMY      Current Outpatient Medications on File Prior to Visit  Medication Sig Dispense Refill  . cabergoline (DOSTINEX) 0.5 MG tablet Take 0.25 mg by mouth 2 (two) times a week.     No current facility-administered medications on file prior to visit.    No Known Allergies  Social History   Socioeconomic History  . Marital status: Single    Spouse name: Not on file  . Number of children: Not on file  . Years of education: Not on file  . Highest education level: Not on file  Occupational History  . Not on file  Tobacco Use  . Smoking status: Never Smoker  . Smokeless tobacco: Never Used  Vaping Use  . Vaping Use: Never used  Substance and Sexual Activity  . Alcohol use: Never  . Drug  use: Never  . Sexual activity: Not Currently    Birth control/protection: None  Other Topics Concern  . Not on file  Social History Narrative  . Not on file   Social Determinants of Health   Financial Resource Strain: Not on file  Food Insecurity: Not on file  Transportation Needs: Not on file  Physical Activity: Not on file  Stress: Not on file  Social Connections: Not on file  Intimate Partner Violence: Not on file    Family History  Problem Relation Age of Onset  . Diabetes Father   . Heart disease Father     The following portions of the patient's history were reviewed and updated as appropriate: allergies, current medications, past family history, past medical history, past social history, past surgical history and problem list.  Review of Systems Pertinent items are noted in HPI.   Objective:  BP 99/71   Pulse 75   Wt 141 lb (64 kg)   LMP 01/29/2021   BMI 22.08 kg/m  Wt Readings from Last 3 Encounters:  02/19/21 141 lb (64 kg)  02/28/20 162 lb (73.5 kg)  02/04/20 168 lb (76.2 kg)     Chaperone present during exam  CONSTITUTIONAL: Well-developed, well-nourished female in no acute distress.  HENT:  Normocephalic, atraumatic, External right and left ear normal. Oropharynx is clear and  moist EYES: Conjunctivae and EOM are normal. Pupils are equal, round, and reactive to light. No scleral icterus.  NECK: Normal range of motion, supple, no masses.  Normal thyroid.   CARDIOVASCULAR: Normal heart rate noted, regular rhythm RESPIRATORY: Clear to auscultation bilaterally. Effort and breath sounds normal, no problems with respiration noted. BREASTS: Symmetric in size. No masses, skin changes, nipple drainage, or lymphadenopathy. ABDOMEN: Soft, normal bowel sounds, no distention noted.  No tenderness, rebound or guarding.  PELVIC: Normal appearing external genitalia; normal appearing vaginal mucosa and cervix.  No abnormal discharge noted.  Normal uterine size, no  other palpable masses, no uterine or adnexal tenderness. MUSCULOSKELETAL: Normal range of motion. No tenderness.  No cyanosis, clubbing, or edema.  2+ distal pulses. SKIN: Skin is warm and dry. No rash noted. Not diaphoretic. No erythema. No pallor. NEUROLOGIC: Alert and oriented to person, place, and time. Normal reflexes, muscle tone coordination. No cranial nerve deficit noted. PSYCHIATRIC: Normal mood and affect. Normal behavior. Normal judgment and thought content.  Assessment:  Annual gynecologic examination with pap smear   Plan:  1. Well Woman Exam Even though not due for PAP, patient would like a PAP today. Will follow up results of pap smear and manage accordingly. - Cytology - PAP( [place])   Routine preventative health maintenance measures emphasized. Please refer to After Visit Summary for other counseling recommendations.    Loma Boston, [place] for [employer]

## 2021-02-23 LAB — CYTOLOGY - PAP
Comment: NEGATIVE
Diagnosis: NEGATIVE
High risk HPV: NEGATIVE

## 2021-12-24 ENCOUNTER — Encounter: Payer: Self-pay | Admitting: General Practice
